# Patient Record
Sex: Male | Born: 1955 | Race: White | Hispanic: No | Marital: Married | Smoking: Never smoker
Health system: Western US, Academic
[De-identification: ages and names within clinical notes are randomized; demographics above are authoritative.]

## PROBLEM LIST (undated history)

## (undated) DIAGNOSIS — Z86010 Personal history of colonic polyps: Secondary | ICD-10-CM

## (undated) DIAGNOSIS — E785 Hyperlipidemia, unspecified: Secondary | ICD-10-CM

## (undated) DIAGNOSIS — N529 Male erectile dysfunction, unspecified: Secondary | ICD-10-CM

## (undated) DIAGNOSIS — J9601 Acute respiratory failure with hypoxia: Secondary | ICD-10-CM

## (undated) DIAGNOSIS — E349 Endocrine disorder, unspecified: Secondary | ICD-10-CM

## (undated) HISTORY — DX: Personal history of colonic polyps: Z86.010

## (undated) HISTORY — PX: FINGER FRACTURE SURGERY: SHX638

## (undated) HISTORY — DX: Male erectile dysfunction, unspecified: N52.9

## (undated) HISTORY — DX: Acute respiratory failure with hypoxia: J96.01

## (undated) HISTORY — DX: Hyperlipidemia, unspecified: E78.5

## (undated) HISTORY — DX: Endocrine disorder, unspecified: E34.9

---

## 1981-10-19 HISTORY — PX: OTHER SURGICAL HISTORY: SHX169

## 2004-11-13 ENCOUNTER — Ambulatory Visit: Payer: Self-pay | Admitting: Internal Medicine

## 2004-11-20 ENCOUNTER — Ambulatory Visit: Payer: Self-pay | Admitting: Internal Medicine

## 2004-12-03 ENCOUNTER — Ambulatory Visit: Payer: Self-pay | Admitting: Internal Medicine

## 2005-01-01 ENCOUNTER — Ambulatory Visit: Payer: Self-pay | Admitting: Internal Medicine

## 2005-01-08 ENCOUNTER — Ambulatory Visit: Payer: Self-pay | Admitting: Internal Medicine

## 2005-10-09 ENCOUNTER — Emergency Department (HOSPITAL_COMMUNITY): Admission: EM | Admit: 2005-10-09 | Discharge: 2005-10-09 | Payer: Self-pay | Admitting: Family Medicine

## 2006-01-19 ENCOUNTER — Ambulatory Visit: Payer: Self-pay | Admitting: Internal Medicine

## 2006-04-06 ENCOUNTER — Ambulatory Visit: Payer: Self-pay | Admitting: Internal Medicine

## 2006-11-29 ENCOUNTER — Ambulatory Visit: Payer: Self-pay | Admitting: Internal Medicine

## 2007-03-22 ENCOUNTER — Ambulatory Visit: Payer: Self-pay | Admitting: Internal Medicine

## 2007-04-08 ENCOUNTER — Ambulatory Visit: Payer: Self-pay | Admitting: Internal Medicine

## 2007-04-08 LAB — CONVERTED CEMR LAB
ALT: 21 units/L (ref 0–40)
AST: 21 units/L (ref 0–37)
Albumin: 3.9 g/dL (ref 3.5–5.2)
Alkaline Phosphatase: 58 units/L (ref 39–117)
BUN: 17 mg/dL (ref 6–23)
Basophils Absolute: 0 10*3/uL (ref 0.0–0.1)
Basophils Relative: 0.6 % (ref 0.0–1.0)
Bilirubin, Direct: 0.1 mg/dL (ref 0.0–0.3)
CO2: 31 meq/L (ref 19–32)
Calcium: 9.4 mg/dL (ref 8.4–10.5)
Chloride: 108 meq/L (ref 96–112)
Cholesterol: 274 mg/dL (ref 0–200)
Creatinine, Ser: 1.1 mg/dL (ref 0.4–1.5)
Direct LDL: 195 mg/dL
Eosinophils Absolute: 0.3 10*3/uL (ref 0.0–0.6)
Eosinophils Relative: 5.4 % — ABNORMAL HIGH (ref 0.0–5.0)
GFR calc Af Amer: 91 mL/min
GFR calc non Af Amer: 75 mL/min
Glucose, Bld: 106 mg/dL — ABNORMAL HIGH (ref 70–99)
HCT: 44.8 % (ref 39.0–52.0)
HDL: 43.8 mg/dL (ref >39.0)
Hemoglobin: 15 g/dL (ref 13.0–17.0)
Lymphocytes Relative: 34.4 % (ref 12.0–46.0)
MCHC: 33.4 g/dL (ref 30.0–36.0)
MCV: 85.9 fL (ref 78.0–100.0)
Monocytes Absolute: 0.5 10*3/uL (ref 0.2–0.7)
Monocytes Relative: 7.8 % (ref 3.0–11.0)
Neutro Abs: 3 10*3/uL (ref 1.4–7.7)
Neutrophils Relative %: 51.8 % (ref 43.0–77.0)
PSA: 1.52 ng/mL (ref 0.10–4.00)
Platelets: 234 10*3/uL (ref 150–400)
Potassium: 4.6 meq/L (ref 3.5–5.1)
RBC: 5.22 M/uL (ref 4.22–5.81)
RDW: 12.6 % (ref 11.5–14.6)
Sodium: 142 meq/L (ref 135–145)
TSH: 2.59 microintl units/mL (ref 0.35–5.50)
Total Bilirubin: 1.1 mg/dL (ref 0.3–1.2)
Total CHOL/HDL Ratio: 6.3
Total Protein: 7.5 g/dL (ref 6.0–8.3)
Triglycerides: 131 mg/dL (ref 0–149)
VLDL: 26 mg/dL (ref 0–40)
WBC: 5.8 10*3/uL (ref 4.5–10.5)

## 2007-04-21 ENCOUNTER — Ambulatory Visit: Payer: Self-pay | Admitting: Internal Medicine

## 2007-04-25 DIAGNOSIS — E785 Hyperlipidemia, unspecified: Secondary | ICD-10-CM | POA: Insufficient documentation

## 2007-06-24 ENCOUNTER — Ambulatory Visit: Payer: Self-pay | Admitting: Gastroenterology

## 2007-08-01 ENCOUNTER — Encounter: Payer: Self-pay | Admitting: Internal Medicine

## 2007-08-01 ENCOUNTER — Ambulatory Visit: Payer: Self-pay | Admitting: Gastroenterology

## 2008-05-01 ENCOUNTER — Ambulatory Visit: Payer: Self-pay | Admitting: Internal Medicine

## 2008-11-09 ENCOUNTER — Ambulatory Visit: Payer: Self-pay | Admitting: Internal Medicine

## 2008-11-12 ENCOUNTER — Ambulatory Visit: Payer: Self-pay | Admitting: Internal Medicine

## 2008-11-12 ENCOUNTER — Telehealth: Payer: Self-pay | Admitting: *Deleted

## 2008-11-19 ENCOUNTER — Encounter: Payer: Self-pay | Admitting: Internal Medicine

## 2008-11-30 ENCOUNTER — Encounter: Admission: RE | Admit: 2008-11-30 | Discharge: 2008-11-30 | Payer: Self-pay | Admitting: Otolaryngology

## 2009-01-15 ENCOUNTER — Encounter: Payer: Self-pay | Admitting: Internal Medicine

## 2009-08-16 ENCOUNTER — Telehealth: Payer: Self-pay | Admitting: Internal Medicine

## 2009-08-16 ENCOUNTER — Encounter: Payer: Self-pay | Admitting: Internal Medicine

## 2009-08-23 ENCOUNTER — Encounter (INDEPENDENT_AMBULATORY_CARE_PROVIDER_SITE_OTHER): Payer: Self-pay | Admitting: *Deleted

## 2009-09-02 ENCOUNTER — Encounter (INDEPENDENT_AMBULATORY_CARE_PROVIDER_SITE_OTHER): Payer: Self-pay | Admitting: *Deleted

## 2009-09-26 ENCOUNTER — Encounter (INDEPENDENT_AMBULATORY_CARE_PROVIDER_SITE_OTHER): Payer: Self-pay | Admitting: *Deleted

## 2009-10-16 ENCOUNTER — Ambulatory Visit: Payer: Self-pay | Admitting: Internal Medicine

## 2009-10-16 LAB — CONVERTED CEMR LAB
ALT: 28 units/L (ref 0–53)
AST: 23 units/L (ref 0–37)
Alkaline Phosphatase: 52 units/L (ref 39–117)
Basophils Absolute: 0 10*3/uL (ref 0.0–0.1)
Bilirubin, Direct: 0.1 mg/dL (ref 0.0–0.3)
CO2: 30 meq/L (ref 19–32)
Chloride: 102 meq/L (ref 96–112)
Creatinine, Ser: 1.1 mg/dL (ref 0.4–1.5)
Direct LDL: 198.4 mg/dL
Eosinophils Relative: 5.5 % — ABNORMAL HIGH (ref 0.0–5.0)
HCT: 46.4 % (ref 39.0–52.0)
HDL: 45.7 mg/dL (ref >39.00)
Ketones, ur: NEGATIVE mg/dL
Leukocytes, UA: NEGATIVE
Lymphocytes Relative: 33 % (ref 12.0–46.0)
Monocytes Relative: 9.1 % (ref 3.0–12.0)
Neutrophils Relative %: 52.1 % (ref 43.0–77.0)
Platelets: 209 10*3/uL (ref 150.0–400.0)
Potassium: 4.2 meq/L (ref 3.5–5.1)
RDW: 12.7 % (ref 11.5–14.6)
Specific Gravity, Urine: 1.02 (ref 1.000–1.030)
Total Bilirubin: 1.4 mg/dL — ABNORMAL HIGH (ref 0.3–1.2)
Total CHOL/HDL Ratio: 6
Triglycerides: 249 mg/dL — ABNORMAL HIGH (ref 0.0–149.0)
Urine Glucose: NEGATIVE mg/dL
Urobilinogen, UA: 0.2 (ref 0.0–1.0)
VLDL: 49.8 mg/dL — ABNORMAL HIGH (ref 0.0–40.0)
WBC: 6.9 10*3/uL (ref 4.5–10.5)
pH: 6 (ref 5.0–8.0)

## 2009-10-23 ENCOUNTER — Ambulatory Visit: Payer: Self-pay | Admitting: Internal Medicine

## 2009-10-23 DIAGNOSIS — R300 Dysuria: Secondary | ICD-10-CM | POA: Insufficient documentation

## 2009-10-24 ENCOUNTER — Encounter: Payer: Self-pay | Admitting: Internal Medicine

## 2010-01-10 ENCOUNTER — Ambulatory Visit: Payer: Self-pay | Admitting: Internal Medicine

## 2010-01-10 LAB — CONVERTED CEMR LAB
ALT: 37 units/L (ref 0–53)
Albumin: 3.9 g/dL (ref 3.5–5.2)
Alkaline Phosphatase: 62 units/L (ref 39–117)
Bilirubin, Direct: 0.1 mg/dL (ref 0.0–0.3)
Cholesterol: 165 mg/dL (ref 0–200)
LDL Cholesterol: 86 mg/dL (ref 0–99)
Total Protein: 7.5 g/dL (ref 6.0–8.3)
Triglycerides: 140 mg/dL (ref 0.0–149.0)
VLDL: 28 mg/dL (ref 0.0–40.0)

## 2010-01-14 ENCOUNTER — Ambulatory Visit: Payer: Self-pay | Admitting: Internal Medicine

## 2010-01-20 ENCOUNTER — Ambulatory Visit: Payer: Self-pay | Admitting: Internal Medicine

## 2010-01-20 DIAGNOSIS — E291 Testicular hypofunction: Secondary | ICD-10-CM | POA: Insufficient documentation

## 2010-01-20 DIAGNOSIS — N138 Other obstructive and reflux uropathy: Secondary | ICD-10-CM | POA: Insufficient documentation

## 2010-01-20 DIAGNOSIS — N401 Enlarged prostate with lower urinary tract symptoms: Secondary | ICD-10-CM

## 2010-01-20 LAB — CONVERTED CEMR LAB: LH: 4.4 milliintl units/mL (ref 1.50–9.30)

## 2010-01-21 ENCOUNTER — Encounter: Payer: Self-pay | Admitting: Internal Medicine

## 2010-01-29 ENCOUNTER — Telehealth: Payer: Self-pay | Admitting: Gastroenterology

## 2010-02-17 ENCOUNTER — Encounter: Payer: Self-pay | Admitting: Internal Medicine

## 2010-08-04 ENCOUNTER — Telehealth: Payer: Self-pay | Admitting: Internal Medicine

## 2010-08-06 ENCOUNTER — Ambulatory Visit: Payer: Self-pay | Admitting: Internal Medicine

## 2010-08-06 LAB — CONVERTED CEMR LAB
Albumin: 4.2 g/dL (ref 3.5–5.2)
HDL: 47 mg/dL (ref >39.00)
LDL Cholesterol: 95 mg/dL (ref 0–99)
Sex Hormone Binding: 27 nmol/L (ref 13–71)
Testosterone Free: 44.1 pg/mL — ABNORMAL LOW (ref 47.0–244.0)
Testosterone-% Free: 2.1 % (ref 1.6–2.9)
Testosterone: 206.57 ng/dL — ABNORMAL LOW (ref 250–890)
Total Bilirubin: 1 mg/dL (ref 0.3–1.2)
Total CHOL/HDL Ratio: 4
Total CK: 186 units/L (ref 7–232)
Triglycerides: 139 mg/dL (ref 0.0–149.0)

## 2010-08-12 ENCOUNTER — Ambulatory Visit: Payer: Self-pay | Admitting: Internal Medicine

## 2010-09-23 ENCOUNTER — Encounter: Payer: Self-pay | Admitting: Internal Medicine

## 2010-11-18 NOTE — Progress Notes (Signed)
Summary: Pt req to get lipids added to testosterone lvl  & lab for muscle  Phone Note Call from Patient Call back at Work Phone [phone]   Caller: Patient Complaint: Cough/Sore throat Summary of Call: Pt is going to [facility] at [facility] to have Testosterone lvls checked on 08/06/10 as previously ordered. Pt also req to get a lipid panel also a panel to check pts muscles. Pt has been experiencing soreness in several joints and muscles for the past month and is getting worse. Pt has a fup appt with Dr. Cato Mulligan on 08/12/10. Pls advise.   Initial call taken by: Lucy Antigua,  August 04, 2010 2:08 PM  Follow-up for Phone Call        add labs lipids 272.4 liver 995.2 add CK  Follow-up by: Birdie Sons MD,  August 05, 2010 8:55 AM  Additional Follow-up for Phone Call Additional follow up Details #1::        Labs have been added, as noted above.  Additional Follow-up by: Lucy Antigua,  August 06, 2010 10:00 AM

## 2010-11-18 NOTE — Assessment & Plan Note (Signed)
Summary: CPX/CJR   Vital Signs:  Patient profile:   55 year old male Height:      70.5 inches Weight:      238 pounds BMI:     33.79 Pulse rate:   64 / minute Resp:     12 per minute BP sitting:   110 / 86  (left arm)  Vitals Entered By: Gladis Riffle, RN (October 23, 2009 8:09 AM)   History of Present Illness: CPX  new problems: has noted progressive erectile dysfunction associated with some prostate discomfort with erections. Has also noted some urinary hesitancy.  Has hx of hyperlipidemia---noncompliant with previous medications.  All other systems reviewed and were negative   Preventive Screening-Counseling & Management  Alcohol-Tobacco     Smoking Status: quit > 6 months     Year Quit: 2000  Comments: tried but never really liked, occasional cigar but does not like it either  Current Problems (verified): 1)  Hyperlipidemia  (ICD-272.4)  Current Medications (verified): 1)  None  Allergies (verified): No Known Drug Allergies  Comments:  Nurse/Medical Assistant: cpx, labs done--has form  The patient's medications were reviewed with the patient's parent and were updated in the Medication and Allergy Lists. Gladis Riffle, RN (October 23, 2009 8:11 AM)  Family History: father-deceased MI/AAA.Marland Kitchen? coccidiomycosis 52 mother- deceased age 32--? leukemia.Marland Kitchen also hx of CAD/stents  Social History: not smoking    Married Smoking Status:  quit > 6 months  Physical Exam  General:  Well-developed,well-nourished,in no acute distress; alert,appropriate and cooperative throughout examination Head:  normocephalic and atraumatic.   Eyes:  pupils equal and pupils round.   Ears:  R ear normal and L ear normal.   Neck:  No deformities, masses, or tenderness noted. no bruits     Lungs:  normal respiratory effort and no intercostal retractions.   Heart:  normal rate and regular rhythm.   Abdomen:  Bowel sounds positive,abdomen soft and non-tender without masses, organomegaly  or hernias noted.  obese Rectal:  No external abnormalities noted. Normal sphincter tone. No rectal masses or tenderness. Prostate:  no nodules and no asymmetry.  moderately enlarged Msk:  No deformity or scoliosis noted of thoracic or lumbar spine.   Pulses:  present  Neurologic:  cranial nerves II-XII intact and gait normal.   Skin:  turgor normal and color normal.  no rash or vesicle seen  Cervical Nodes:  tender left ac area no mass or bruit    no pc tendernss  Psych:  Oriented X3, normally interactive, and good eye contact.     Impression & Recommendations:  Problem # 1:  Preventive Health Care (ICD-V70.0) health maint UTD  Problem # 2:  HYPERLIPIDEMIA (ICD-272.4) discussed at length ---side effects discussed His updated medication list for this problem includes:    Simvastatin 40 Mg Tabs (Simvastatin) .Marland Kitchen... Take one tablet at bedtime  Problem # 3:  ERECTILE DYSFUNCTION (ICD-302.72)  discussed---trial cialis---side effectsdiscussed  His updated medication list for this problem includes:    Cialis 20 Mg Tabs (Tadalafil) .Marland Kitchen... 1/2- 1 tablet every other day as needed for erectile dysfunction  Complete Medication List: 1)  Simvastatin 40 Mg Tabs (Simvastatin) .... Take one tablet at bedtime 2)  Cialis 20 Mg Tabs (Tadalafil) .... 1/2- 1 tablet every other day as needed for erectile dysfunction  Other Orders: T-Culture, Urine ([id number])  Preventive Care Screening  Colonoscopy:    Date:  08/01/2007    Next Due:  07/2012    Results:  abnormal, polyp    Patient Instructions: 1)  6 weeks no office visit 2)  lipids 272.4 3)  liver 995.2  Prescriptions: CIALIS 20 MG TABS (TADALAFIL) 1/2- 1 tablet every other day as needed for erectile dysfunction  #10 x 11   Entered and Authorized by:   Birdie Sons MD   Signed by:   Birdie Sons MD on 10/23/2009   Method used:   Print then Give to Patient   RxID:   [id number] SIMVASTATIN 40 MG TABS (SIMVASTATIN) Take one  tablet at bedtime  #90 x 3   Entered and Authorized by:   Birdie Sons MD   Signed by:   Birdie Sons MD on 10/23/2009   Method used:   Electronically to        [facility] [address] (retail)       [address]       [place], [place]  [address]       Ph: [phone] or [phone]       Fax: [phone]   RxID:   [id number]   Prevention & Chronic Care Immunizations   Influenza vaccine: Not documented    Tetanus booster: 10/19/2000: Td    Pneumococcal vaccine: Not documented  Colorectal Screening   Hemoccult: Not documented    Colonoscopy: abnormal, polyp  (08/01/2007)   Colonoscopy due: 07/2012  Other Screening   PSA: 2.57  (10/16/2009)   Smoking status: quit > 6 months  (10/23/2009)  Lipids   Total Cholesterol: 280  (10/16/2009)   LDL: DEL  (04/08/2007)   LDL Direct: 198.4  (10/16/2009)   HDL: 45.70  (10/16/2009)   Triglycerides: 249.0  (10/16/2009)    SGOT (AST): 23  (10/16/2009)   SGPT (ALT): 28  (10/16/2009)   Alkaline phosphatase: 52  (10/16/2009)   Total bilirubin: 1.4  (10/16/2009)  Self-Management Support :    Lipid self-management support: Not documented     Preventive Care Screening  Colonoscopy:    Date:  08/01/2007    Next Due:  07/2012    Results:  abnormal, polyp

## 2010-11-18 NOTE — Assessment & Plan Note (Signed)
Summary: lab FU/et/pt rescd from bump//ccm   Vital Signs:  Patient profile:   55 year old male Pulse rate:   64 / minute Pulse rhythm:   regular Resp:     12 per minute BP sitting:   110 / 80  (left arm)  Vitals Entered By: Gladis Riffle, RN (January 20, 2010 7:50 AM) CC: lab FU, discuss abdominal discomfort x 1 month or so Is Patient Diabetic? No   CC:  lab FU and discuss abdominal discomfort x 1 month or so.  History of Present Illness:  Follow-Up Visit      This is a 55 year old man who presents for Follow-up visit.  The patient denies chest pain and palpitations.  Since the last visit the patient notes no new problems or concerns.  The patient reports taking meds as prescribed.  When questioned about possible medication side effects, the patient notes none.    All other systems reviewed and were negative   Preventive Screening-Counseling & Management  Alcohol-Tobacco     Smoking Status: quit > 6 months     Year Quit: 2000  Current Medications (verified): 1)  Simvastatin 40 Mg Tabs (Simvastatin) .... Take One Tablet At Bedtime 2)  Cialis 20 Mg Tabs (Tadalafil) .... 1/2- 1 Tablet Every Other Day As Needed For Erectile Dysfunction  Allergies (verified): No Known Drug Allergies  Past History:  Past Medical History: Last updated: 11/12/2008 Hyperlipidemia      Past Surgical History: Last updated: 11/09/2008 fifth finger fx R   Family History: Last updated: 2009/11/11 father-deceased MI/AAA.Marland Kitchen? coccidiomycosis 9 mother- deceased age 65--? leukemia.Marland Kitchen also hx of CAD/stents  Social History: Last updated: 11-11-09 not smoking    Married  Risk Factors: Smoking Status: quit > 6 months (01/20/2010)  Physical Exam  General:  Well-developed,well-nourished,in no acute distress; alert,appropriate and cooperative throughout examination Head:  normocephalic and atraumatic.   Eyes:  pupils equal and pupils round.   Ears:  R ear normal and L ear normal.   Neck:  No  deformities, masses, or tenderness noted. no bruits     Lungs:  normal respiratory effort and no intercostal retractions.   Heart:  normal rate and regular rhythm.   Abdomen:  Bowel sounds positive,abdomen soft and non-tender without masses, organomegaly or hernias noted.  obese Msk:  No deformity or scoliosis noted of thoracic or lumbar spine.   Neurologic:  cranial nerves II-XII intact and gait normal.     Impression & Recommendations:  Problem # 1:  HYPERLIPIDEMIA (ICD-272.4) controlled continue current medications  His updated medication list for this problem includes:    Simvastatin 40 Mg Tabs (Simvastatin) .Marland Kitchen... Take one tablet at bedtime  Labs Reviewed: SGOT: 25 (01/10/2010)   SGPT: 37 (01/10/2010)   HDL:51.50 (01/10/2010), 45.70 (10/16/2009)  LDL:86 (01/10/2010), DEL (16/07/9603)  Chol:165 (01/10/2010), 280 (10/16/2009)  Trig:140.0 (01/10/2010), 249.0 (10/16/2009)  Problem # 2:  HYPOGONADISM (ICD-257.2) discussed at length trial clomid  Problem # 3:  DIARRHEA (ICD-787.91)  has been exposed to "purified water"  Orders: T-Culture, Stool ([id number]) T-Stool Giardia / Crypto- EIA (54098) T-Stool for O&P (11914-78295) T-Fecal WBC ([id number])  Complete Medication List: 1)  Simvastatin 40 Mg Tabs (Simvastatin) .... Take one tablet at bedtime 2)  Cialis 20 Mg Tabs (Tadalafil) .... 1/2- 1 tablet every other day as needed for erectile dysfunction 3)  Clomiphene Citrate 50 Mg Tabs (Clomiphene citrate) .... 1/2 tablet mwf  Other Orders: Urology Referral (Urology)  Patient Instructions: 1)  Please schedule a  follow-up appointment in 3 months. 2)  testosterone (total and free) one week prior Prescriptions: CLOMIPHENE CITRATE 50 MG TABS (CLOMIPHENE CITRATE) 1/2 tablet MWF  #30 x 1   Entered and Authorized by:   Birdie Sons MD   Signed by:   Birdie Sons MD on 01/20/2010   Method used:   Electronically to        [facility] [address]  (retail)       [address]       [place], [place]  [address]       Ph: [phone] or [phone]       Fax: [phone]   RxID:   [id number]

## 2010-11-18 NOTE — Letter (Signed)
Summary: [facility]  [facility]   Imported By: Maryln Gottron 02/26/2010 13:20:30  _____________________________________________________________________  External Attachment:    Type:   Image     Comment:   External Document

## 2010-11-18 NOTE — Progress Notes (Signed)
Summary: Switch from dr Jarold Motto to DR Leone Payor   Phone Note From Other Clinic   Caller: Aurther Loft @ Dr Cato Mulligan [phone] Call For: Dr Jarold Motto Summary of Call: Wants to switch from Dr Jarold Motto to Dr Leone Payor because he says he is a personal friend of Dr Leone Payor and would rather see him. Initial call taken by: Leanor Kail [facility],  January 29, 2010 3:41 PM  Follow-up for Phone Call        [place] with me... Follow-up by: Mardella Layman MD FACG,  January 30, 2010 8:34 AM  Additional Follow-up for Phone Call Additional follow up Details #1::        ok Iva Boop MD, [facility]  January 30, 2010 10:29 AM     Additional Follow-up for Phone Call Additional follow up Details #2::    Next available appoinment scheduled with Aurther Loft at Dr Agnes Lawrence office. Mar 10, 2010 3pm. New patient paperwork mailed to patient. Follow-up by: Leanor Kail [facility],  January 31, 2010 10:33 AM

## 2010-11-18 NOTE — Assessment & Plan Note (Signed)
Summary: fup re: testosterone lvl/cjr   Vital Signs:  Patient profile:   55 year old male Weight:      235 pounds BMI:     33.36 Temp:     98.4 degrees F oral Pulse rate:   68 / minute BP sitting:   112 / 82  (left arm) Cuff size:   large  Vitals Entered By: Alfred Levins, CMA (August 12, 2010 8:22 AM) CC: discuss labs   CC:  discuss labs.  History of Present Illness:  Follow-Up Visit      This is a 55 year old man who presents for Follow-up visit.  The patient denies chest pain and palpitations.  Since the last visit the patient notes no new problems or concerns except multiple MSK complaints. Especially right biceps area "painful in the bone".  The patient reports taking meds as prescribed.  When questioned about possible medication side effects, the patient notes none.    he reports feeling generally weaker than normal. still able to go on long hikes  All other systems reviewed and were negative   Current Problems (verified): 1)  Benign Prostatic Hypertrophy, With Obstruction  (ICD-600.01) 2)  Hypogonadism  (ICD-257.2) 3)  Dysuria, Chronic  (ICD-788.1) 4)  Erectile Dysfunction  (ICD-302.72) 5)  Preventive Health Care  (ICD-V70.0) 6)  Hyperlipidemia  (ICD-272.4)  Current Medications (verified): 1)  Simvastatin 40 Mg Tabs (Simvastatin) .... Take One Tablet At Bedtime 2)  Cialis 20 Mg Tabs (Tadalafil) .... 1/2- 1 Tablet Every Other Day As Needed For Erectile Dysfunction 3)  Clomiphene Citrate 50 Mg Tabs (Clomiphene Citrate) .... 1/2 Tablet Mwf  Allergies (verified): No Known Drug Allergies  Physical Exam  General:  Well-developed,well-nourished,in no acute distress; alert,appropriate and cooperative throughout examination Head:  normocephalic and atraumatic.   Eyes:  pupils equal and pupils round.   Ears:  R ear normal and L ear normal.   Neck:  No deformities, masses, or tenderness noted. Lungs:  normal respiratory effort and no intercostal retractions.   Heart:   normal rate and regular rhythm.   Abdomen:  Bowel sounds positive,abdomen soft and non-tender without masses, organomegaly or hernias noted.  obese Skin:  turgor normal and color normal.   Psych:  normally interactive and good eye contact.     Impression & Recommendations:  Problem # 1:  HYPOGONADISM (ICD-257.2) i suspect the primary cause of his fatigue clomid not effective change to testosterone replacement check level in 3 months side effects discussed  Problem # 2:  HYPERLIPIDEMIA (ICD-272.4) controlled His updated medication list for this problem includes:    Simvastatin 40 Mg Tabs (Simvastatin) .Marland Kitchen... Take one tablet at bedtime  Labs Reviewed: SGOT: 23 (08/06/2010)   SGPT: 30 (08/06/2010)   HDL:47.00 (08/06/2010), 51.50 (01/10/2010)  LDL:95 (08/06/2010), 86 (01/10/2010)  Chol:170 (08/06/2010), 165 (01/10/2010)  Trig:139.0 (08/06/2010), 140.0 (01/10/2010)  Problem # 3:  ARM PAIN, RIGHT (ICD-729.5)  only nocturnal sxs unusual story probably worth taking an xray  Orders: T-Humerus Right (73060TC)  Complete Medication List: 1)  Simvastatin 40 Mg Tabs (Simvastatin) .... Take one tablet at bedtime 2)  Cialis 20 Mg Tabs (Tadalafil) .... 1/2- 1 tablet every other day as needed for erectile dysfunction 3)  Testosterone Cypionate 200 Mg/ml Oil (Testosterone cypionate) .Marland Kitchen.. 1cc intramuscularly monthly  Patient Instructions: 1)  3 months. testosterone level only 2)  call for results of labs about 5-7 days after lab work Prescriptions: TESTOSTERONE CYPIONATE 200 MG/ML OIL (TESTOSTERONE CYPIONATE) 1cc intramuscularly monthly  #10 cc  x 0   Entered and Authorized by:   Birdie Sons MD   Signed by:   Birdie Sons MD on 08/12/2010   Method used:   Print then Give to Patient   RxID:   [id number]    Orders Added: 1)  Est. Patient Level IV [32355] 2)  T-Humerus Right [73060TC]

## 2010-11-18 NOTE — Medication Information (Signed)
Summary: Prior Authorization and Approval for Androderm  Prior Authorization and Approval for Androderm   Imported By: Maryln Gottron 09/25/2010 12:58:06  _____________________________________________________________________  External Attachment:    Type:   Image     Comment:   External Document

## 2011-03-18 ENCOUNTER — Other Ambulatory Visit: Payer: Self-pay | Admitting: Internal Medicine

## 2011-03-18 DIAGNOSIS — R0600 Dyspnea, unspecified: Secondary | ICD-10-CM

## 2011-04-10 ENCOUNTER — Encounter: Payer: Self-pay | Admitting: Internal Medicine

## 2011-04-10 ENCOUNTER — Ambulatory Visit (INDEPENDENT_AMBULATORY_CARE_PROVIDER_SITE_OTHER): Payer: BC Managed Care – PPO | Admitting: Internal Medicine

## 2011-04-10 VITALS — BP 128/82 | HR 84 | Temp 99.0°F | Ht 70.5 in | Wt 247.0 lb

## 2011-04-10 DIAGNOSIS — R0989 Other specified symptoms and signs involving the circulatory and respiratory systems: Secondary | ICD-10-CM

## 2011-04-10 DIAGNOSIS — R0600 Dyspnea, unspecified: Secondary | ICD-10-CM

## 2011-04-10 DIAGNOSIS — E291 Testicular hypofunction: Secondary | ICD-10-CM

## 2011-04-10 DIAGNOSIS — R0609 Other forms of dyspnea: Secondary | ICD-10-CM

## 2011-04-10 MED ORDER — ASPIRIN 325 MG PO TABS: 325.0000 mg | ORAL_TABLET | Freq: Every day | ORAL | Status: AC

## 2011-04-10 MED ORDER — METOPROLOL TARTRATE 25 MG PO TABS: 25.0000 mg | ORAL_TABLET | Freq: Two times a day (BID) | ORAL | Status: DC

## 2011-04-10 MED ORDER — NITROGLYCERIN 0.4 MG SL SUBL: 0.4000 mg | SUBLINGUAL_TABLET | SUBLINGUAL | Status: DC | PRN

## 2011-04-10 NOTE — Assessment & Plan Note (Addendum)
I'm concerned with the patient's history. He needs further evaluation. I'll schedule for a stress Myoview. I've given him a prescription for nitroglycerin. I've also asked him to start taking aspirin 325 mg by mouth daily. Side effects discussed. Given his bradycardia I will not start a beta blocker at this time.

## 2011-04-10 NOTE — Progress Notes (Signed)
  Subjective:    Patient ID: Derek Pennington, male    DOB: 1956-02-05, 55 y.o.   MRN: [id number]  HPI  55 year old patient with known hyperlipidemia comes in for evaluation of symptoms of urinary retention, depression, fatigue and shortness of breath. Depression---a lot of stress at work  He is mostly concerned about dyspnea- states with any exertion "uphill" he gets very SOB. He was able to complete a 55 mile hike. He does admit to some chest tightness. Has associated dizziness.   Urinary retention---ongoing problem for quite some time.  Past Medical History  Diagnosis Date  . Hyperlipidemia   . Testosterone deficiency   . ED (erectile dysfunction)    Past Surgical History  Procedure Date  . Finger fracture surgery     fifth    reports that he has quit smoking. He does not have any smokeless tobacco history on file. He reports that he drinks alcohol. He reports that he does not use illicit drugs. family history includes Aneurysm in his father; Heart attack in his father; Heart disease in his mother; and Leukemia in his mother. Not on File   Review of Systems  . Denies lower extremity edema, abdominal pain, change in appetite, change in bowel movements. Patient denies rashes, musculoskeletal complaints. No other specific complaints in a complete review of systems except. He admits to erectile dysfunction, recurrent urinary urgency     Objective:   Physical Exam Obese male in no acute distress. HEENT exam atraumatic, normocephalic, extraocular muscles are intact. Neck supple without lymphadenopathy, thyromegaly, jugular venous distention. Chest is clear to auscultation without increased work of breathing. Cardiac exam S1-S2 are regular. Extremities there is no clubbing or cyanosis. He has good peripheral pulses. No bruits noted.       Assessment & Plan:

## 2011-04-21 ENCOUNTER — Ambulatory Visit (HOSPITAL_COMMUNITY): Payer: BC Managed Care – PPO | Attending: Internal Medicine | Admitting: Radiology

## 2011-04-21 VITALS — Ht 70.0 in | Wt 242.0 lb

## 2011-04-21 DIAGNOSIS — R0989 Other specified symptoms and signs involving the circulatory and respiratory systems: Secondary | ICD-10-CM

## 2011-04-21 DIAGNOSIS — R0600 Dyspnea, unspecified: Secondary | ICD-10-CM

## 2011-04-21 DIAGNOSIS — I491 Atrial premature depolarization: Secondary | ICD-10-CM

## 2011-04-21 DIAGNOSIS — R0789 Other chest pain: Secondary | ICD-10-CM

## 2011-04-21 DIAGNOSIS — R55 Syncope and collapse: Secondary | ICD-10-CM

## 2011-04-21 DIAGNOSIS — R0609 Other forms of dyspnea: Secondary | ICD-10-CM | POA: Insufficient documentation

## 2011-04-21 MED ORDER — TECHNETIUM TC 99M TETROFOSMIN IV KIT
33.0000 | PACK | Freq: Once | INTRAVENOUS | Status: AC | PRN
  Administered 2011-04-21: 33 via INTRAVENOUS

## 2011-04-21 MED ORDER — TECHNETIUM TC 99M TETROFOSMIN IV KIT
11.0000 | PACK | Freq: Once | INTRAVENOUS | Status: AC | PRN
  Administered 2011-04-21: 11 via INTRAVENOUS

## 2011-04-21 NOTE — Progress Notes (Signed)
[facility] SITE 3 NUCLEAR MED [address] [place] [address] [phone]  Cardiology Nuclear Med Study  Staton Markey is a 55 y.o. male [id number] 1956/06/19   Nuclear Med Background Indication for Stress Test:  Evaluation for Ischemia History:  '04 MWU:XLKGMW per patient. Cardiac Risk Factors: Family History - CAD, History of Smoking and Lipids  Symptoms:  Chest Tightness (last episode of chest discomfort was last night), Diaphoresis, Dizziness, DOE and Near Syncope   Nuclear Pre-Procedure Caffeine/Decaff Intake:  None NPO After: 9:00pm   Lungs:  Clear. IV 0.9% NS with Angio Cath:  18g  IV Site: R Antecubital  IV Started by:  Stanton Kidney, EMT-P  Chest Size (in):  46 Cup Size: n/a  Height: 5\' 10"  (1.778 m)  Weight:  242 lb (109.77 kg)  BMI:  Body mass index is 34.72 kg/(m^2). Tech Comments:  NA    Nuclear Med Study 1 or 2 day study: 1 day  Stress Test Type:  Stress  Reading MD: Charlton Haws, MD  Order Authorizing Provider:  Birdie Sons, MD  Resting Radionuclide: Technetium 33m Tetrofosmin  Resting Radionuclide Dose: 11 mCi   Stress Radionuclide:  Technetium 15m Tetrofosmin  Stress Radionuclide Dose: 33 mCi           Stress Protocol Rest HR: 57 Stress HR: 160  Rest BP: 108/84 Stress BP: 201/88  Exercise Time (min): 9:00 METS: 10.4   Predicted Max HR: 165 bpm % Max HR: 96.97 bpm Rate Pressure Product: 10272   Dose of Adenosine (mg):  n/a Dose of Lexiscan: n/a mg  Dose of Atropine (mg): n/a Dose of Dobutamine: n/a mcg/kg/min (at max HR)  Stress Test Technologist: Smiley Houseman, CMA-N  Nuclear Technologist:  Domenic Polite, CNMT     Rest Procedure:  Myocardial perfusion imaging was performed at rest 45 minutes following the intravenous administration of Technetium 36m Tetrofosmin.  Rest ECG: Nonspecific T-wave changes.  Stress Procedure:  The patient exercised for nine minutes on the treadmill utilizing the Bruce protocol.  The patient  stopped due to fatigue and denied any chest pain during exercise, but did c/o chest tightness, 1-2/10, four minutes into recovery .  There were no diagnostic ST-T wave changes, just rare PAC's.  He did have a mild hypertensive response to exercise, 201/88.  Technetium 54m Tetrofosmin was injected at peak exercise and myocardial perfusion imaging was performed after a brief delay.  Stress ECG: No significant change from baseline ECG  QPS Raw Data Images:  Normal; no motion artifact; normal heart/lung ratio. Stress Images:  Normal homogeneous uptake in all areas of the myocardium. Rest Images:  Normal homogeneous uptake in all areas of the myocardium. Subtraction (SDS):  Normal Transient Ischemic Dilatation (Normal <1.22):  .90 Lung/Heart Ratio (Normal <0.45):  .38  Quantitative Gated Spect Images QGS EDV:  107 ml QGS ESV:  38 ml QGS cine images:  NL LV Function; NL Wall Motion QGS EF: 64%  Impression Exercise Capacity:  Fair exercise capacity. BP Response:  Hypertensive blood pressure response. Clinical Symptoms:  Mild chest pain/dyspnea. ECG Impression:  No significant ST segment change suggestive of ischemia. Comparison with Prior Nuclear Study: No previous nuclear study performed  Overall Impression:  Normal stress nuclear study.     Charlton Haws

## 2011-04-27 NOTE — Progress Notes (Signed)
Nuc med study routed to Dr. Cato Mulligan.04/27/11 Derek Pennington

## 2011-04-30 ENCOUNTER — Telehealth: Payer: Self-pay | Admitting: *Deleted

## 2011-04-30 NOTE — Telephone Encounter (Signed)
Pt is calling for stress test results, and what he should do next....?  Should he make an appt with Dr. Cato Mulligan? After reading Dr. Marliss Coots last office note, it is obvious he is concerned about this pt and the results of his stress test.  Will ask Dr. Lovell Sheehan for guidance on this, and send message to Dr. Cato Mulligan just in case he is looking at messages.

## 2011-04-30 NOTE — Telephone Encounter (Signed)
Pt given results, and will call back to schedule an appt with Dr. Cato Mulligan to discuss tests in detail, and what to do next.

## 2011-04-30 NOTE — Telephone Encounter (Signed)
Here it is

## 2011-05-08 ENCOUNTER — Telehealth: Payer: Self-pay | Admitting: *Deleted

## 2011-05-08 NOTE — Telephone Encounter (Signed)
Pt was told of finding ,but states something is still not right and request an ov to discuss- ov with dr burchette 7-26

## 2011-05-08 NOTE — Telephone Encounter (Signed)
Pt calling to get results of stress test done a few weeks ago.  Needs know what his treatment plan would be based on results.  Does he need to see another physician since [facility] is out or should he wait to follow up when [facility] returns?

## 2011-05-08 NOTE — Telephone Encounter (Signed)
Per dr Lovell Sheehan- normal stress test with some increase in bp with exertion- needs to loose weight and that would help

## 2011-05-14 ENCOUNTER — Ambulatory Visit: Payer: BC Managed Care – PPO | Admitting: Family Medicine

## 2011-05-18 ENCOUNTER — Ambulatory Visit: Payer: BC Managed Care – PPO | Admitting: Internal Medicine

## 2012-06-29 ENCOUNTER — Other Ambulatory Visit: Payer: Self-pay | Admitting: Internal Medicine

## 2012-07-04 ENCOUNTER — Other Ambulatory Visit: Payer: BC Managed Care – PPO

## 2012-07-11 ENCOUNTER — Encounter: Payer: BC Managed Care – PPO | Admitting: Family

## 2012-07-22 ENCOUNTER — Telehealth: Payer: Self-pay | Admitting: Internal Medicine

## 2012-07-22 NOTE — Telephone Encounter (Signed)
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 Please advise.

## 2012-07-25 NOTE — Telephone Encounter (Signed)
You can use that 8:30 slot on 10/25 and tell pt to come in at 8

## 2012-08-11 ENCOUNTER — Encounter: Payer: Self-pay | Admitting: Internal Medicine

## 2012-08-12 ENCOUNTER — Ambulatory Visit (INDEPENDENT_AMBULATORY_CARE_PROVIDER_SITE_OTHER): Payer: BC Managed Care – PPO | Admitting: Internal Medicine

## 2012-08-12 ENCOUNTER — Encounter: Payer: Self-pay | Admitting: Internal Medicine

## 2012-08-12 VITALS — BP 116/88 | HR 76 | Temp 98.1°F | Ht 71.0 in | Wt 244.0 lb

## 2012-08-12 DIAGNOSIS — Z0000 Encounter for general adult medical examination without abnormal findings: Secondary | ICD-10-CM

## 2012-08-12 DIAGNOSIS — Z23 Encounter for immunization: Secondary | ICD-10-CM

## 2012-08-12 LAB — HEPATIC FUNCTION PANEL
ALT: 24 U/L (ref 0–53)
AST: 22 U/L (ref 0–37)
Albumin: 3.8 g/dL (ref 3.5–5.2)
Alkaline Phosphatase: 60 U/L (ref 39–117)

## 2012-08-12 LAB — BASIC METABOLIC PANEL
BUN: 17 mg/dL (ref 6–23)
Calcium: 9.2 mg/dL (ref 8.4–10.5)
Creatinine, Ser: 1.1 mg/dL (ref 0.4–1.5)
GFR: 72.66 mL/min (ref >60.00)
Glucose, Bld: 79 mg/dL (ref 70–99)

## 2012-08-12 LAB — POCT URINALYSIS DIPSTICK
Bilirubin, UA: NEGATIVE
Ketones, UA: NEGATIVE
pH, UA: 6.5

## 2012-08-12 LAB — CBC WITH DIFFERENTIAL/PLATELET
Basophils Absolute: 0.1 10*3/uL (ref 0.0–0.1)
HCT: 45 % (ref 39.0–52.0)
Lymphocytes Relative: 27 % (ref 12.0–46.0)
Lymphs Abs: 1.9 10*3/uL (ref 0.7–4.0)
Monocytes Relative: 9.6 % (ref 3.0–12.0)
Neutrophils Relative %: 56.3 % (ref 43.0–77.0)
Platelets: 219 10*3/uL (ref 150.0–400.0)
RDW: 13.4 % (ref 11.5–14.6)
WBC: 7 10*3/uL (ref 4.5–10.5)

## 2012-08-12 LAB — TSH: TSH: 1.66 u[IU]/mL (ref 0.35–5.50)

## 2012-08-12 LAB — TESTOSTERONE: Testosterone: 271.24 ng/dL — ABNORMAL LOW (ref 350.00–890.00)

## 2012-08-12 LAB — LIPID PANEL
Total CHOL/HDL Ratio: 4
Triglycerides: 134 mg/dL (ref 0.0–149.0)

## 2012-08-12 NOTE — Progress Notes (Signed)
Patient ID: Derek Pennington, male   DOB: 1955-11-13, 56 y.o.   MRN: [id number]  CPX  Past Medical History  Diagnosis Date  . Hyperlipidemia   . Testosterone deficiency   . ED (erectile dysfunction)     History   Social History  . Marital Status: Married    Spouse Name: N/A    Number of Children: N/A  . Years of Education: N/A   Occupational History  . Not on file.   Social History Main Topics  . Smoking status: Former Games developer  . Smokeless tobacco: Not on file  . Alcohol Use: Yes  . Drug Use: No  . Sexually Active: Not on file   Other Topics Concern  . Not on file   Social History Narrative  . No narrative on file    Past Surgical History  Procedure Date  . Finger fracture surgery     fifth    Family History  Problem Relation Age of Onset  . Heart disease Mother     stents  . Leukemia Mother   . Heart attack Father   . Aneurysm Father     AAA    No Known Allergies  Current Outpatient Prescriptions on File Prior to Visit  Medication Sig Dispense Refill  . simvastatin (ZOCOR) 40 MG tablet TAKE ONE TABLET AT BEDTIME  90 tablet  2  . tadalafil (CIALIS) 10 MG tablet Take 10 mg by mouth daily as needed.      . testosterone cypionate (DEPOTESTOTERONE CYPIONATE) 200 MG/ML injection Inject 200 mg into the muscle every 14 (fourteen) days.        Marland Kitchen DISCONTD: nitroGLYCERIN (NITROSTAT) 0.4 MG SL tablet Place 1 tablet (0.4 mg total) under the tongue every 5 (five) minutes as needed for chest pain.  100 tablet  3     patient denies chest pain, shortness of breath, orthopnea. Denies lower extremity edema, abdominal pain, change in appetite, change in bowel movements. Patient denies rashes, musculoskeletal complaints. No other specific complaints in a complete review of systems, except for decrease urinary flow, no nocturia.  BP 116/88  Pulse 76  Temp 98.1 F (36.7 C) (Oral)  Ht 5\' 11"  (1.803 m)  Wt 244 lb (110.678 kg)  BMI 34.03 kg/m2 Well-developed male in no acute  distress. HEENT exam atraumatic, normocephalic, extraocular muscles are intact. Conjunctivae are pink without exudate. Neck is supple without lymphadenopathy, thyromegaly, jugular venous distention. Chest is clear to auscultation without increased work of breathing. Cardiac exam S1-S2 are regular. The PMI is normal. No significant murmurs or gallops. Abdominal exam active bowel sounds, soft, nontender. No abdominal bruits. Extremities no clubbing cyanosis or edema. Peripheral pulses are normal without bruits. Neurologic exam alert and oriented without any motor or sensory deficits. Rectal exam normal tone prostate normal size without masses or asymmetry.   Well Visit: health maint UTD

## 2012-08-15 ENCOUNTER — Other Ambulatory Visit: Payer: Self-pay | Admitting: *Deleted

## 2012-08-15 LAB — TB SKIN TEST: TB Skin Test: NEGATIVE

## 2012-08-15 MED ORDER — TADALAFIL 10 MG PO TABS: 10.0000 mg | ORAL_TABLET | Freq: Every day | ORAL | Status: DC | PRN

## 2012-08-15 MED ORDER — SIMVASTATIN 40 MG PO TABS: 40.0000 mg | ORAL_TABLET | Freq: Every day | ORAL | Status: DC

## 2012-08-15 MED ORDER — TESTOSTERONE 20.25 MG/1.25GM (1.62%) TD GEL: 2.0000 | Freq: Every day | TRANSDERMAL | Status: DC

## 2013-01-02 ENCOUNTER — Telehealth: Payer: Self-pay | Admitting: *Deleted

## 2013-01-02 MED ORDER — OSELTAMIVIR PHOSPHATE 75 MG PO CAPS: 75.0000 mg | ORAL_CAPSULE | Freq: Every day | ORAL | Status: DC

## 2013-01-02 NOTE — Telephone Encounter (Signed)
Pt of Dr. Cato Mulligan, wife diagnosed with flu today, wife requested her husband be treated preventively with Tamiflu.  Rx sent to pt pharmacy

## 2013-02-02 ENCOUNTER — Telehealth: Payer: Self-pay | Admitting: Internal Medicine

## 2013-02-02 NOTE — Telephone Encounter (Signed)
Check testosterone and then we'll make a decision

## 2013-02-02 NOTE — Telephone Encounter (Signed)
Patient Information:  Caller Name: Cutler  Phone: [phone]  Patient: Telesforo, Brosnahan  Gender: Male  DOB: 20-Nov-1955  Age: 57 Years  PCP: Birdie Sons (Adults only)  Office Follow Up:  Does the office need to follow up with this patient?: Yes  Instructions For The Office: Please address patient's question regarding medication change.  Thank you.   Symptoms  Reason For Call & Symptoms: Patient reports he has been prescribed  Androgel and would like to try Axiron instead  as he has discount coupons for same.  Uses [facility] at [facility]  He is agreeable to use Androgel if MD feels there is significant benefit.  Reviewed Health History In EMR: Yes  Reviewed Medications In EMR: Yes  Reviewed Allergies In EMR: Yes  Reviewed Surgeries / Procedures: Yes  Date of Onset of Symptoms: Unknown  Guideline(s) Used:  No Protocol Available - Information Only  Disposition Per Guideline:   Discuss with PCP and Callback by Nurse Today  Reason For Disposition Reached:   Nursing judgment  Advice Given:  Call Back If:  New symptoms develop  You become worse.  Patient Will Follow Care Advice:  YES

## 2013-02-06 ENCOUNTER — Telehealth: Payer: Self-pay | Admitting: Internal Medicine

## 2013-02-06 NOTE — Telephone Encounter (Signed)
lmovm to schedule

## 2013-02-06 NOTE — Telephone Encounter (Signed)
Opened in error

## 2013-02-06 NOTE — Telephone Encounter (Signed)
Please refer to previous note. Per patient he has only been taking testosterone for one week and just need the previous request expedited. Please assist.

## 2013-02-06 NOTE — Telephone Encounter (Signed)
Call pt and schedule lab appt for testosterone levels

## 2013-02-07 NOTE — Telephone Encounter (Signed)
Change from androgel to axiron

## 2013-02-08 MED ORDER — TESTOSTERONE 30 MG/ACT TD SOLN: 1.0000 "application " | Freq: Every day | TRANSDERMAL | Status: DC

## 2013-02-08 NOTE — Telephone Encounter (Signed)
axiron 1 application to alternating underarms daily #3/1 refill

## 2013-02-08 NOTE — Telephone Encounter (Signed)
rx called in, pt aware 

## 2013-02-26 ENCOUNTER — Other Ambulatory Visit: Payer: Self-pay | Admitting: Internal Medicine

## 2013-04-24 ENCOUNTER — Encounter: Payer: Self-pay | Admitting: Internal Medicine

## 2013-04-24 ENCOUNTER — Telehealth: Payer: Self-pay

## 2013-04-24 NOTE — Telephone Encounter (Signed)
Left message on mobile and home #'s to call us back so we may set up pre-visit and colonoscopy appointment date/times.  Dx: V12.72 per Dr. Leone Payor.

## 2013-05-19 HISTORY — PX: COLONOSCOPY: SHX174

## 2013-05-22 ENCOUNTER — Ambulatory Visit (AMBULATORY_SURGERY_CENTER): Payer: BC Managed Care – PPO | Admitting: *Deleted

## 2013-05-22 ENCOUNTER — Encounter: Payer: Self-pay | Admitting: Internal Medicine

## 2013-05-22 ENCOUNTER — Telehealth: Payer: Self-pay | Admitting: Internal Medicine

## 2013-05-22 ENCOUNTER — Other Ambulatory Visit: Payer: Self-pay | Admitting: Internal Medicine

## 2013-05-22 VITALS — Ht 71.0 in | Wt 249.2 lb

## 2013-05-22 DIAGNOSIS — N401 Enlarged prostate with lower urinary tract symptoms: Secondary | ICD-10-CM

## 2013-05-22 DIAGNOSIS — Z860100 Personal history of colon polyps, unspecified: Secondary | ICD-10-CM

## 2013-05-22 DIAGNOSIS — N138 Other obstructive and reflux uropathy: Secondary | ICD-10-CM

## 2013-05-22 DIAGNOSIS — F528 Other sexual dysfunction not due to a substance or known physiological condition: Secondary | ICD-10-CM

## 2013-05-22 MED ORDER — NA SULFATE-K SULFATE-MG SULF 17.5-3.13-1.6 GM/177ML PO SOLN: ORAL | Status: DC

## 2013-05-22 NOTE — Telephone Encounter (Signed)
Pt would like to schedule testosterone level. Pt stated MD request to check his level before any  More refills

## 2013-05-22 NOTE — Telephone Encounter (Signed)
Please schedule lab appt

## 2013-05-22 NOTE — Progress Notes (Signed)
No allergies to eggs or soy. No problems with anesthesia.  

## 2013-05-22 NOTE — Telephone Encounter (Signed)
Scheduled

## 2013-05-23 ENCOUNTER — Other Ambulatory Visit (INDEPENDENT_AMBULATORY_CARE_PROVIDER_SITE_OTHER): Payer: BC Managed Care – PPO

## 2013-05-23 DIAGNOSIS — F528 Other sexual dysfunction not due to a substance or known physiological condition: Secondary | ICD-10-CM

## 2013-05-23 DIAGNOSIS — N401 Enlarged prostate with lower urinary tract symptoms: Secondary | ICD-10-CM

## 2013-05-23 DIAGNOSIS — N138 Other obstructive and reflux uropathy: Secondary | ICD-10-CM

## 2013-05-25 ENCOUNTER — Other Ambulatory Visit: Payer: Self-pay | Admitting: Internal Medicine

## 2013-06-01 ENCOUNTER — Encounter: Payer: Self-pay | Admitting: Internal Medicine

## 2013-06-01 ENCOUNTER — Ambulatory Visit (AMBULATORY_SURGERY_CENTER): Payer: BC Managed Care – PPO | Admitting: Internal Medicine

## 2013-06-01 VITALS — BP 120/73 | HR 58 | Temp 98.0°F | Resp 16 | Ht 71.0 in | Wt 249.0 lb

## 2013-06-01 DIAGNOSIS — D126 Benign neoplasm of colon, unspecified: Secondary | ICD-10-CM

## 2013-06-01 DIAGNOSIS — Z86010 Personal history of colonic polyps: Secondary | ICD-10-CM

## 2013-06-01 MED ORDER — SODIUM CHLORIDE 0.9 % IV SOLN: 500.0000 mL | INTRAVENOUS | Status: DC

## 2013-06-01 NOTE — Op Note (Signed)
[facility] [address] [place], [address]   COLONOSCOPY PROCEDURE REPORT  PATIENT: Derek Pennington, Derek Pennington  MR#: [id number] BIRTHDATE: 09-Apr-1956 , 57  yrs. old GENDER: Male ENDOSCOPIST: Iva Boop, MD, [name] PROCEDURE DATE:  06/01/2013 PROCEDURE:   Colonoscopy with snare polypectomy First Screening Colonoscopy - Avg.  risk and is 50 yrs.  old or older - No.  Prior Negative Screening - Now for repeat screening. N/A  History of Adenoma - Now for follow-up colonoscopy & has been > or = to 3 yrs.  Yes hx of adenoma.  Has been 3 or more years since last colonoscopy.  Polyps Removed Today? Yes. ASA CLASS:   Class II INDICATIONS:Patient's personal history of colon polyps.   Polyp removed MEDICATIONS: propofol (Diprivan) 200mg  IV, MAC sedation, administered by CRNA, and These medications were titrated to patient response per physician's verbal order  DESCRIPTION OF PROCEDURE:   After the risks benefits and alternatives of the procedure were thoroughly explained, informed consent was obtained.  A digital rectal exam revealed no abnormalities of the rectum, A digital rectal exam revealed no prostatic nodules, and A digital rectal exam revealed the prostate was not enlarged.   The LB [id number]  endoscope was introduced through the anus and advanced to the cecum, which was identified by both the appendix and ileocecal valve. No adverse events experienced.   The quality of the prep was excellent using Suprep  The instrument was then slowly withdrawn as the colon was fully examined.    COLON FINDINGS: Three diminutive sessile polyps were found in the transverse colon and at the splenic flexure.  A polypectomy was performed with a cold snare.  The resection was complete and the polyp tissue was completely retrieved.   The colon mucosa was otherwise normal.  Retroflexed views revealed no abnormalities. The time to cecum=1 minutes 59 seconds.  Withdrawal time=10  minutes 39 seconds.  The scope was withdrawn and the procedure completed. COMPLICATIONS: There were no complications.  ENDOSCOPIC IMPRESSION: 1.   Three diminutive sessile polyps were found in the transverse colon and at the splenic flexure; polypectomy was performed with a cold snare 2.   The colon mucosa was otherwise normal - excellent prep - prior polyp removed not recovered  RECOMMENDATIONS: Timing of repeat colonoscopy will be determined by pathology findings.   eSigned:  Iva Boop, MD, [facility] 06/01/2013 3:33 PM  cc: The Patient

## 2013-06-01 NOTE — Progress Notes (Signed)
Patient did not experience any of the following events: a burn prior to discharge; a fall within the facility; wrong site/side/patient/procedure/implant event; or a hospital transfer or hospital admission upon discharge from the facility. (G8907) Patient did not have preoperative order for IV antibiotic SSI prophylaxis. (G8918)  

## 2013-06-01 NOTE — Progress Notes (Signed)
A/ox3 pleased with MAC, report to Karen RN 

## 2013-06-01 NOTE — Patient Instructions (Addendum)
I found and removed 3 small polyps that look benign. Everything else was ok.  I will let you know pathology results and when to have another routine colonoscopy by mail.  I appreciate the opportunity to care for you. Iva Boop, MD, FACG     YOU HAD AN ENDOSCOPIC PROCEDURE TODAY AT THE [facility]: Refer to the procedure report that was given to you for any specific questions about what was found during the examination.  If the procedure report does not answer your questions, please call your gastroenterologist to clarify.  If you requested that your care partner not be given the details of your procedure findings, then the procedure report has been included in a sealed envelope for you to review at your convenience later.  YOU SHOULD EXPECT: Some feelings of bloating in the abdomen. Passage of more gas than usual.  Walking can help get rid of the air that was put into your GI tract during the procedure and reduce the bloating. If you had a lower endoscopy (such as a colonoscopy or flexible sigmoidoscopy) you may notice spotting of blood in your stool or on the toilet paper. If you underwent a bowel prep for your procedure, then you may not have a normal bowel movement for a few days.  DIET: Your first meal following the procedure should be a light meal and then it is ok to progress to your normal diet.  A half-sandwich or bowl of soup is an example of a good first meal.  Heavy or fried foods are harder to digest and may make you feel nauseous or bloated.  Likewise meals heavy in dairy and vegetables can cause extra gas to form and this can also increase the bloating.  Drink plenty of fluids but you should avoid alcoholic beverages for 24 hours.  ACTIVITY: Your care partner should take you home directly after the procedure.  You should plan to take it easy, moving slowly for the rest of the day.  You can resume normal activity the day after the procedure however you should NOT  DRIVE or use heavy machinery for 24 hours (because of the sedation medicines used during the test).    SYMPTOMS TO REPORT IMMEDIATELY: A gastroenterologist can be reached at any hour.  During normal business hours, 8:30 AM to 5:00 PM Monday through Friday, call [phone].  After hours and on weekends, please call the GI answering service at [phone] who will take a message and have the physician on call contact you.   Following lower endoscopy (colonoscopy or flexible sigmoidoscopy):  Excessive amounts of blood in the stool  Significant tenderness or worsening of abdominal pains  Swelling of the abdomen that is new, acute  Fever of 100F or higher  FOLLOW UP: If any biopsies were taken you will be contacted by phone or by letter within the next 1-3 weeks.  Call your gastroenterologist if you have not heard about the biopsies in 3 weeks.  Our staff will call the home number listed on your records the next business day following your procedure to check on you and address any questions or concerns that you may have at that time regarding the information given to you following your procedure. This is a courtesy call and so if there is no answer at the home number and we have not heard from you through the emergency physician on call, we will assume that you have returned to your regular daily activities without incident.  SIGNATURES/CONFIDENTIALITY: You and/or your care partner have signed paperwork which will be entered into your electronic medical record.  These signatures attest to the fact that that the information above on your After Visit Summary has been reviewed and is understood.  Full responsibility of the confidentiality of this discharge information lies with you and/or your care-partner.

## 2013-06-01 NOTE — Progress Notes (Signed)
Called to room to assist during endoscopic procedure.  Patient ID and intended procedure confirmed with present staff. Received instructions for my participation in the procedure from the performing physician.  

## 2013-06-02 ENCOUNTER — Telehealth: Payer: Self-pay

## 2013-06-02 NOTE — Telephone Encounter (Signed)
  Follow up Call-  Call back number 06/01/2013  Post procedure Call Back phone  # [phone]  Permission to leave phone message Yes     Left message on answering machine.

## 2013-06-07 ENCOUNTER — Encounter: Payer: Self-pay | Admitting: Internal Medicine

## 2013-06-07 DIAGNOSIS — Z860100 Personal history of colon polyps, unspecified: Secondary | ICD-10-CM | POA: Insufficient documentation

## 2013-06-07 HISTORY — DX: Personal history of colon polyps, unspecified: Z86.0100

## 2013-06-07 HISTORY — DX: Personal history of colonic polyps: Z86.010

## 2013-06-07 NOTE — Progress Notes (Signed)
Quick Note:  2 diminutive adenomas Repeat colon 2019 ______ 

## 2013-09-28 ENCOUNTER — Other Ambulatory Visit (INDEPENDENT_AMBULATORY_CARE_PROVIDER_SITE_OTHER): Payer: BC Managed Care – PPO

## 2013-09-28 ENCOUNTER — Telehealth: Payer: Self-pay | Admitting: Internal Medicine

## 2013-09-28 DIAGNOSIS — E349 Endocrine disorder, unspecified: Secondary | ICD-10-CM

## 2013-09-28 DIAGNOSIS — E291 Testicular hypofunction: Secondary | ICD-10-CM

## 2013-09-28 NOTE — Telephone Encounter (Signed)
Pt had testosterone level checked.  Do you need to see him afterwards?

## 2013-09-28 NOTE — Telephone Encounter (Signed)
Pt calling to request order to have testosterone level checked today.  States his medication was adjusted and it is time for his levels to be checked.  Pt states he will also need refill of axiron.

## 2013-09-28 NOTE — Telephone Encounter (Signed)
Future order placed 

## 2013-09-28 NOTE — Telephone Encounter (Signed)
Patient had lab appointment today and was wondering if he needs to set up an appointment for a visit with the doctor in regards to his lab visit today. He will be out of town so you can use his cell phone number to call him on.

## 2013-09-29 NOTE — Telephone Encounter (Signed)
Pt aware.

## 2013-09-29 NOTE — Telephone Encounter (Signed)
No. We will call him with instructions

## 2013-10-05 ENCOUNTER — Telehealth: Payer: Self-pay | Admitting: Internal Medicine

## 2013-10-05 NOTE — Telephone Encounter (Signed)
Pt waiting on instructions about med for testosterone. pt down to the last day. pls advise. Pharm: [facility]

## 2013-10-06 ENCOUNTER — Other Ambulatory Visit: Payer: Self-pay | Admitting: *Deleted

## 2013-10-06 DIAGNOSIS — E349 Endocrine disorder, unspecified: Secondary | ICD-10-CM

## 2013-10-06 MED ORDER — TESTOSTERONE 30 MG/ACT TD SOLN: 1.0000 "application " | Freq: Every day | TRANSDERMAL | Status: DC

## 2013-10-06 NOTE — Telephone Encounter (Signed)
Consulted with Derek Pennington, gave pt his instructions to continue normal usage of testosterone, please call in a refill. Pt leaving tomorrow. Pt will set an appointment for lab work when he returns.

## 2013-10-06 NOTE — Telephone Encounter (Signed)
Sent to pharmacy 

## 2013-10-27 ENCOUNTER — Other Ambulatory Visit: Payer: BC Managed Care – PPO

## 2013-10-27 DIAGNOSIS — E349 Endocrine disorder, unspecified: Secondary | ICD-10-CM

## 2013-10-30 LAB — TESTOSTERONE, FREE, TOTAL, SHBG
SEX HORMONE BINDING: 21 nmol/L (ref 13–71)
Testosterone, Free: 45.4 pg/mL — ABNORMAL LOW (ref 47.0–244.0)
Testosterone-% Free: 2.4 % (ref 1.6–2.9)
Testosterone: 189 ng/dL — ABNORMAL LOW (ref 300–890)

## 2013-11-03 ENCOUNTER — Telehealth: Payer: Self-pay | Admitting: Internal Medicine

## 2013-11-03 NOTE — Telephone Encounter (Signed)
Pt would like a call back to discuss his labs. Please advise.

## 2013-11-07 NOTE — Telephone Encounter (Signed)
See result note.  

## 2013-11-10 ENCOUNTER — Telehealth: Payer: Self-pay | Admitting: Internal Medicine

## 2013-11-10 MED ORDER — OSELTAMIVIR PHOSPHATE 75 MG PO CAPS: 75.0000 mg | ORAL_CAPSULE | Freq: Two times a day (BID) | ORAL | Status: DC

## 2013-11-10 NOTE — Telephone Encounter (Signed)
Talked to Dr Elease Hashimoto and per Dr Elease Hashimoto ok to call in Tamiflu 75 mg bid x5 days, rx sent in electronically, pt aware

## 2013-11-10 NOTE — Telephone Encounter (Signed)
Patient Information:  Caller Name: Haze  Phone: [phone]  Patient: Derek Pennington  Gender: Male  DOB: 1956-03-15  Age: 58 Years  PCP: Phoebe Sharps (Adults only)  Office Follow Up:  Does the office need to follow up with this patient?: Yes  Instructions For The Office: Tamiflu Rx krs/can  RN Note:  Onset of fever, cough, and wheeze 11/09/13 PM.  Son diagnosed with positive flu 11/08/13 and wants Tamiflu called in.  Per flu protocol, emergent symptoms denied; advised care measures, with callback parameters given.  Wants Tamiflu Rx; uses [facility].  Info to office for provider review/Rx/callback.  May reach patient at [phone].  krs/can  Symptoms  Reason For Call & Symptoms: influenza  Reviewed Health History In EMR: Yes  Reviewed Medications In EMR: Yes  Reviewed Allergies In EMR: Yes  Reviewed Surgeries / Procedures: Yes  Date of Onset of Symptoms: 11/09/2013  Any Fever: Yes  Fever Taken: Oral  Fever Time Of Reading: 11:30:00  Fever Last Reading: 102  Guideline(s) Used:  Influenza - Seasonal  Disposition Per Guideline:   Discuss with PCP and Callback by Nurse Today  Reason For Disposition Reached:   Patient requests antiviral medicine for influenza and flu symptoms present < 48 hours  Advice Given:  Reassurance  For most healthy adults, influenza feels like a bad cold. The dangers of influenza for normal, healthy people (under 79 years of age) are overrated.  The treatment of influenza depends on your main symptoms. Generally, treatment is the same as for other viral respiratory infections (colds). Bed rest is unnecessary.  Treating the Symptoms of Flu  Fever, Muscle Aches, and Headache: For fever more than 101 F (38.3 C), muscle aches, and headaches, take acetaminophen every 4-6 hours (Adults 650 mg) OR ibuprofen every 6-8 hours (Adults 400-600 mg).  Sore Throat: Use throat lozenges, hard candy or warm chicken broth.  Cough: Use cough  drops.  Hydrate: Drink extra liquids. If the air in your home is dry, use a humidifier.  No Aspirin  : Do not use aspirin for treatment of fever or pain (Reason: there is an association between influenza and Reye syndrome).  Isolation is Needed Until After the Fever is Gone:   The CDC recommends that people with influenza-like illness remain at home until at least 24 hours after they are free of fever (100 F or 37.8C).  Do NOT go to work or school.  Do NOT go to church, child care centers, shopping, or other public places.  Do NOT shake hands.  Avoid close contact with others (hugging, kissing).  Expected Course  : The fever lasts 2-3 days, the runny nose 5-10 days, and the cough 2-3 weeks.  Expected Course  : The fever lasts 2-3 days, the runny nose 5-10 days, and the cough 2-3 weeks.  Call Back If:  Fever lasts more than 3 days  Runny nose lasts more than 10 days  Cough lasts more than 3 weeks  You become short of breath or worse.  Patient Will Follow Care Advice:  YES

## 2013-11-10 NOTE — Progress Notes (Signed)
Yes, stop topical testosterone  Testosterone cypionate 200mg /ml. Inject 1cc IM (at [place]) q 2weeks  10 cc/1 refill.  Check testosterone level in 12 weeks

## 2013-11-30 ENCOUNTER — Encounter: Payer: Self-pay | Admitting: Family Medicine

## 2013-11-30 ENCOUNTER — Ambulatory Visit (INDEPENDENT_AMBULATORY_CARE_PROVIDER_SITE_OTHER): Payer: BC Managed Care – PPO | Admitting: Family Medicine

## 2013-11-30 VITALS — BP 120/84 | Temp 98.8°F | Wt 246.0 lb

## 2013-11-30 DIAGNOSIS — J45909 Unspecified asthma, uncomplicated: Secondary | ICD-10-CM

## 2013-11-30 MED ORDER — PREDNISONE 20 MG PO TABS: ORAL_TABLET | ORAL | Status: DC

## 2013-11-30 MED ORDER — HYDROCODONE-HOMATROPINE 5-1.5 MG/5ML PO SYRP: 5.0000 mL | ORAL_SOLUTION | Freq: Three times a day (TID) | ORAL | Status: DC | PRN

## 2013-11-30 NOTE — Patient Instructions (Signed)
Prednisone 20 mg...... 2 tabs x3 days or until you feel a lot better then taper as outlined  Hydromet......Marland Kitchen 1/2-1 teaspoon at bedtime when necessary for cough

## 2013-11-30 NOTE — Progress Notes (Signed)
   Subjective:    Patient ID: Derek Pennington, male    DOB: June 11, 1956, 57 y.o.   MRN: [id number]  HPI Derek Pennington is a 58 year old male nonsmoker........ 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 It's nonproductive no fever and he has some soreness in his right chest when he coughs a lot. Review of systems negative  Environmental contact......... nothing new at home he has cats and dogs no history of allergy to either.......Marland Kitchen recently been working in the lab with cats and formaldehyde but they have a shield and that's only been recent and not related to the cough he feels   Review of Systems    review of systems otherwise negative no history of asthma or pneumonia Objective:   Physical Exam  Well-developed well-nourished male no acute distress vital signs stable he is afebrile ENT negative neck was supple lungs were clear      Assessment & Plan:  Reactive airway disease......Marland Kitchen prednisone burst and taper

## 2013-11-30 NOTE — Progress Notes (Signed)
Pre visit review using our clinic review tool, if applicable. No additional management support is needed unless otherwise documented below in the visit note. 

## 2013-12-04 ENCOUNTER — Ambulatory Visit: Payer: BC Managed Care – PPO | Admitting: Family Medicine

## 2013-12-06 ENCOUNTER — Telehealth: Payer: Self-pay | Admitting: Internal Medicine

## 2013-12-06 DIAGNOSIS — J45909 Unspecified asthma, uncomplicated: Secondary | ICD-10-CM

## 2013-12-06 MED ORDER — HYDROCODONE-HOMATROPINE 5-1.5 MG/5ML PO SYRP: 5.0000 mL | ORAL_SOLUTION | Freq: Three times a day (TID) | ORAL | Status: DC | PRN

## 2013-12-06 NOTE — Telephone Encounter (Signed)
Rx ready for pick up and patient is aware.  Patient informed that if symptoms do not improve he should schedule an office visit.

## 2013-12-06 NOTE — Telephone Encounter (Signed)
Pt is requesting another rx for hydrocodone cough syrup. Pt still has cough

## 2013-12-07 ENCOUNTER — Telehealth: Payer: Self-pay | Admitting: Internal Medicine

## 2013-12-07 NOTE — Telephone Encounter (Signed)
Appt scheduled

## 2013-12-07 NOTE — Telephone Encounter (Signed)
Per Dr Sherren Mocha patient should follow up with Dr Leanne Chang

## 2013-12-07 NOTE — Telephone Encounter (Signed)
Pt seen by dr todd 2/16 dx w/ RAD.  Pt states he is NOT better, actually feels worse. Pleurisy is worse at night. Pt would like to know what he should do. come back in, or go to specialist?  But wants something ASAP

## 2013-12-11 ENCOUNTER — Ambulatory Visit (INDEPENDENT_AMBULATORY_CARE_PROVIDER_SITE_OTHER)
Admission: RE | Admit: 2013-12-11 | Discharge: 2013-12-11 | Disposition: A | Discharge status: Home / Self Care | Payer: BC Managed Care – PPO | Source: Ambulatory Visit | Attending: Internal Medicine | Admitting: Internal Medicine

## 2013-12-11 ENCOUNTER — Inpatient Hospital Stay (HOSPITAL_COMMUNITY): Payer: BC Managed Care – PPO

## 2013-12-11 ENCOUNTER — Emergency Department (HOSPITAL_COMMUNITY): Payer: BC Managed Care – PPO

## 2013-12-11 ENCOUNTER — Encounter (HOSPITAL_COMMUNITY): Payer: Self-pay | Admitting: Emergency Medicine

## 2013-12-11 ENCOUNTER — Inpatient Hospital Stay (HOSPITAL_COMMUNITY)
Admission: EM | Admit: 2013-12-11 | Discharge: 2013-12-18 | DRG: 853 | Disposition: A | Discharge status: Home / Self Care | Payer: BC Managed Care – PPO | Attending: Pulmonary Disease | Admitting: Pulmonary Disease

## 2013-12-11 ENCOUNTER — Ambulatory Visit (INDEPENDENT_AMBULATORY_CARE_PROVIDER_SITE_OTHER): Payer: BC Managed Care – PPO | Admitting: Internal Medicine

## 2013-12-11 ENCOUNTER — Telehealth: Payer: Self-pay | Admitting: *Deleted

## 2013-12-11 ENCOUNTER — Telehealth: Payer: Self-pay | Admitting: Internal Medicine

## 2013-12-11 ENCOUNTER — Encounter: Payer: Self-pay | Admitting: Internal Medicine

## 2013-12-11 VITALS — BP 132/90 | HR 98 | Temp 98.0°F | Ht 71.0 in | Wt 242.0 lb

## 2013-12-11 DIAGNOSIS — R0600 Dyspnea, unspecified: Secondary | ICD-10-CM

## 2013-12-11 DIAGNOSIS — J154 Pneumonia due to other streptococci: Secondary | ICD-10-CM | POA: Diagnosis present

## 2013-12-11 DIAGNOSIS — Z79899 Other long term (current) drug therapy: Secondary | ICD-10-CM

## 2013-12-11 DIAGNOSIS — D62 Acute posthemorrhagic anemia: Secondary | ICD-10-CM | POA: Diagnosis not present

## 2013-12-11 DIAGNOSIS — J90 Pleural effusion, not elsewhere classified: Secondary | ICD-10-CM

## 2013-12-11 DIAGNOSIS — J45909 Unspecified asthma, uncomplicated: Secondary | ICD-10-CM

## 2013-12-11 DIAGNOSIS — J189 Pneumonia, unspecified organism: Secondary | ICD-10-CM | POA: Insufficient documentation

## 2013-12-11 DIAGNOSIS — J9600 Acute respiratory failure, unspecified whether with hypoxia or hypercapnia: Secondary | ICD-10-CM

## 2013-12-11 DIAGNOSIS — R0602 Shortness of breath: Secondary | ICD-10-CM

## 2013-12-11 DIAGNOSIS — A419 Sepsis, unspecified organism: Principal | ICD-10-CM | POA: Diagnosis present

## 2013-12-11 DIAGNOSIS — F528 Other sexual dysfunction not due to a substance or known physiological condition: Secondary | ICD-10-CM

## 2013-12-11 DIAGNOSIS — E785 Hyperlipidemia, unspecified: Secondary | ICD-10-CM

## 2013-12-11 DIAGNOSIS — J9601 Acute respiratory failure with hypoxia: Secondary | ICD-10-CM

## 2013-12-11 DIAGNOSIS — N138 Other obstructive and reflux uropathy: Secondary | ICD-10-CM

## 2013-12-11 DIAGNOSIS — Z87891 Personal history of nicotine dependence: Secondary | ICD-10-CM

## 2013-12-11 DIAGNOSIS — N529 Male erectile dysfunction, unspecified: Secondary | ICD-10-CM | POA: Diagnosis present

## 2013-12-11 DIAGNOSIS — R300 Dysuria: Secondary | ICD-10-CM

## 2013-12-11 DIAGNOSIS — R0609 Other forms of dyspnea: Secondary | ICD-10-CM

## 2013-12-11 DIAGNOSIS — R071 Chest pain on breathing: Secondary | ICD-10-CM | POA: Diagnosis present

## 2013-12-11 DIAGNOSIS — N401 Enlarged prostate with lower urinary tract symptoms: Secondary | ICD-10-CM

## 2013-12-11 DIAGNOSIS — J869 Pyothorax without fistula: Secondary | ICD-10-CM | POA: Diagnosis present

## 2013-12-11 DIAGNOSIS — N179 Acute kidney failure, unspecified: Secondary | ICD-10-CM | POA: Diagnosis present

## 2013-12-11 DIAGNOSIS — Z806 Family history of leukemia: Secondary | ICD-10-CM

## 2013-12-11 DIAGNOSIS — I10 Essential (primary) hypertension: Secondary | ICD-10-CM | POA: Diagnosis present

## 2013-12-11 DIAGNOSIS — G8918 Other acute postprocedural pain: Secondary | ICD-10-CM | POA: Diagnosis not present

## 2013-12-11 DIAGNOSIS — E291 Testicular hypofunction: Secondary | ICD-10-CM

## 2013-12-11 DIAGNOSIS — Z8249 Family history of ischemic heart disease and other diseases of the circulatory system: Secondary | ICD-10-CM

## 2013-12-11 DIAGNOSIS — R0989 Other specified symptoms and signs involving the circulatory and respiratory systems: Secondary | ICD-10-CM

## 2013-12-11 DIAGNOSIS — Z86010 Personal history of colonic polyps: Secondary | ICD-10-CM

## 2013-12-11 DIAGNOSIS — R6520 Severe sepsis without septic shock: Secondary | ICD-10-CM

## 2013-12-11 HISTORY — DX: Acute respiratory failure with hypoxia: J96.01

## 2013-12-11 LAB — I-STAT CHEM 8, ED
BUN: 20 mg/dL (ref 6–23)
Calcium, Ion: 1.13 mmol/L (ref 1.12–1.23)
Chloride: 98 meq/L (ref 96–112)
Creatinine, Ser: 1.3 mg/dL (ref 0.50–1.35)
Glucose, Bld: 135 mg/dL — ABNORMAL HIGH (ref 70–99)
HCT: 52 % (ref 39.0–52.0)
Hemoglobin: 17.7 g/dL — ABNORMAL HIGH (ref 13.0–17.0)
Potassium: 4.5 meq/L (ref 3.7–5.3)
Sodium: 136 meq/L — ABNORMAL LOW (ref 137–147)
TCO2: 28 mmol/L (ref 0–100)

## 2013-12-11 LAB — CBC WITH DIFFERENTIAL/PLATELET
BASOS PCT: 0 % (ref 0–1)
Basophils Absolute: 0 10*3/uL (ref 0.0–0.1)
EOS PCT: 0 % (ref 0–5)
Eosinophils Absolute: 0 10*3/uL (ref 0.0–0.7)
HEMATOCRIT: 46.3 % (ref 39.0–52.0)
Hemoglobin: 16.3 g/dL (ref 13.0–17.0)
LYMPHS PCT: 3 % — AB (ref 12–46)
Lymphs Abs: 1.1 10*3/uL (ref 0.7–4.0)
MCH: 30 pg (ref 26.0–34.0)
MCHC: 35.2 g/dL (ref 30.0–36.0)
MCV: 85.3 fL (ref 78.0–100.0)
MONO ABS: 2.3 10*3/uL — AB (ref 0.1–1.0)
Monocytes Relative: 6 % (ref 3–12)
Neutro Abs: 34.9 10*3/uL — ABNORMAL HIGH (ref 1.7–7.7)
Neutrophils Relative %: 91 % — ABNORMAL HIGH (ref 43–77)
Platelets: 262 10*3/uL (ref 150–400)
RBC: 5.43 MIL/uL (ref 4.22–5.81)
RDW: 13.5 % (ref 11.5–15.5)
WBC: 38.3 10*3/uL — ABNORMAL HIGH (ref 4.0–10.5)

## 2013-12-11 LAB — GLUCOSE, CAPILLARY: Glucose-Capillary: 110 mg/dL — ABNORMAL HIGH (ref 70–99)

## 2013-12-11 LAB — BODY FLUID CELL COUNT WITH DIFFERENTIAL
Eos, Fluid: 0 %
LYMPHS FL: 9 %
Monocyte-Macrophage-Serous Fluid: 6 % — ABNORMAL LOW (ref 50–90)
Neutrophil Count, Fluid: 85 % — ABNORMAL HIGH (ref 0–25)
WBC FLUID: 10627 uL — AB (ref 0–1000)

## 2013-12-11 LAB — LACTATE DEHYDROGENASE, PLEURAL OR PERITONEAL FLUID: LD, Fluid: 1116 U/L — ABNORMAL HIGH (ref 3–23)

## 2013-12-11 LAB — STREP PNEUMONIAE URINARY ANTIGEN: STREP PNEUMO URINARY ANTIGEN: NEGATIVE

## 2013-12-11 LAB — PROCALCITONIN: Procalcitonin: 3 ng/mL

## 2013-12-11 LAB — MRSA PCR SCREENING: MRSA by PCR: NEGATIVE

## 2013-12-11 MED ORDER — FENTANYL CITRATE 0.05 MG/ML IJ SOLN
100.0000 ug | Freq: Once | INTRAMUSCULAR | Status: AC
  Administered 2013-12-11: 100 ug via INTRAVENOUS
  Filled 2013-12-11: qty 2

## 2013-12-11 MED ORDER — VANCOMYCIN HCL IN DEXTROSE 1-5 GM/200ML-% IV SOLN
1000.0000 mg | Freq: Once | INTRAVENOUS | Status: DC
  Filled 2013-12-11: qty 200

## 2013-12-11 MED ORDER — AZITHROMYCIN 500 MG IV SOLR
500.0000 mg | INTRAVENOUS | Status: DC
  Administered 2013-12-11 – 2013-12-14 (×4): 500 mg via INTRAVENOUS
  Filled 2013-12-11 (×5): qty 500

## 2013-12-11 MED ORDER — KETOROLAC TROMETHAMINE 30 MG/ML IJ SOLN
30.0000 mg | Freq: Four times a day (QID) | INTRAMUSCULAR | Status: DC | PRN
  Administered 2013-12-11 – 2013-12-13 (×4): 30 mg via INTRAVENOUS
  Filled 2013-12-11 (×4): qty 1

## 2013-12-11 MED ORDER — IOHEXOL 350 MG/ML SOLN
100.0000 mL | Freq: Once | INTRAVENOUS | Status: AC | PRN
  Administered 2013-12-11: 100 mL via INTRAVENOUS

## 2013-12-11 MED ORDER — OXYCODONE-ACETAMINOPHEN 5-325 MG PO TABS
1.0000 | ORAL_TABLET | ORAL | Status: DC | PRN
  Administered 2013-12-11 – 2013-12-13 (×6): 1 via ORAL
  Filled 2013-12-11 (×6): qty 1

## 2013-12-11 MED ORDER — DEXTROSE-NACL 5-0.45 % IV SOLN
INTRAVENOUS | Status: DC
  Administered 2013-12-11: 1000 mL via INTRAVENOUS
  Administered 2013-12-12: 13:00:00 via INTRAVENOUS

## 2013-12-11 MED ORDER — PIPERACILLIN-TAZOBACTAM 3.375 G IVPB 30 MIN
3.3750 g | Freq: Once | INTRAVENOUS | Status: AC
  Administered 2013-12-11: 3.375 g via INTRAVENOUS
  Filled 2013-12-11: qty 50

## 2013-12-11 MED ORDER — DEXTROSE 5 % IV SOLN
1.0000 g | INTRAVENOUS | Status: DC
  Administered 2013-12-12 – 2013-12-17 (×6): 1 g via INTRAVENOUS
  Filled 2013-12-11 (×10): qty 10

## 2013-12-11 MED ORDER — VANCOMYCIN HCL 10 G IV SOLR
1500.0000 mg | Freq: Once | INTRAVENOUS | Status: AC
  Administered 2013-12-11: 1500 mg via INTRAVENOUS
  Filled 2013-12-11: qty 1500

## 2013-12-11 MED ORDER — SODIUM CHLORIDE 0.9 % IV SOLN: 250.0000 mL | INTRAVENOUS | Status: DC | PRN

## 2013-12-11 MED ORDER — MOXIFLOXACIN HCL 400 MG PO TABS: 400.0000 mg | ORAL_TABLET | Freq: Every day | ORAL | Status: DC

## 2013-12-11 MED ORDER — ENOXAPARIN SODIUM 40 MG/0.4ML ~~LOC~~ SOLN
40.0000 mg | SUBCUTANEOUS | Status: DC
  Administered 2013-12-11 – 2013-12-12 (×2): 40 mg via SUBCUTANEOUS
  Filled 2013-12-11 (×3): qty 0.4

## 2013-12-11 MED ORDER — ASPIRIN 81 MG PO CHEW: 324.0000 mg | CHEWABLE_TABLET | ORAL | Status: DC

## 2013-12-11 MED ORDER — ASPIRIN 300 MG RE SUPP: 300.0000 mg | RECTAL | Status: DC

## 2013-12-11 NOTE — ED Notes (Signed)
Dr Simonds at bedside. 

## 2013-12-11 NOTE — Telephone Encounter (Signed)
Pt's wife called stating she called 64 and pt is going to hospital.  She would like to know the results to his x-ray.

## 2013-12-11 NOTE — Telephone Encounter (Signed)
Called Dr Leanne Chang at [phone] to inform him of xray results, per Dr Leanne Chang call in Avelox 400 mg once daily x7 days.  Called pt at home and wife told me that pt was on his way to ER due to increased dyspnea.  Called Dr Leanne Chang back to see if he still wanted me to call in the Avelox or wait.  Per Dr Leanne Chang wait to call it in, pts wife aware.  Told wife to call me back in the morning to let me know how pt is doing.  Pt's wife verbalized understanding and had questions

## 2013-12-11 NOTE — Progress Notes (Signed)
Pre visit review using our clinic review tool, if applicable. No additional management support is needed unless otherwise documented below in the visit note. 

## 2013-12-11 NOTE — Telephone Encounter (Signed)
Patient Information:  Caller Name: Santa Genera  Phone: [phone]  Patient: Leslee, Haueter  Gender: Male  DOB: May 03, 1956  Age: 58 Years  PCP: Phoebe Sharps (Adults only)  Office Follow Up:  Does the office need to follow up with this patient?: Yes   Instructions For The Office: Office please call wife back regarding CHX results from today.    RN Note:  Instructed wife to hang up and call 911 now; will comply but she states that she would like for me to find out the results of his chest xray that he had done today after his OV that he had early today in our office  Symptoms  Reason For Call & Symptoms: EMERGENT CALL: Wife is calling and states that pt is having hard time breathing; can not get up and move without being very SOB; speaking single words only due to SOB;  instructed to hang up and call 911 now;  Reviewed Health History In EMR: N/A  Reviewed Medications In EMR: N/A  Reviewed Allergies In EMR: N/A  Reviewed Surgeries / Procedures: N/A  Date of Onset of Symptoms: 12/11/2013  Guideline(s) Used:  Breathing Difficulty  Disposition Per Guideline:   Call EMS 911 Now  Reason For Disposition Reached:   Severe difficulty breathing (e.g., struggling for each breath, speaks in single words, pulse > 120)  Advice Given:  N/A  Patient Will Follow Care Advice:  YES

## 2013-12-11 NOTE — Procedures (Addendum)
Thoracentesis Procedure Note  Pre-operative Diagnosis: parapneumonic effusion  Post-operative Diagnosis: same   Procedure Details  Consent: Informed consent was obtained. Risks of the procedure were discussed including: infection, bleeding, pain, pneumothorax.  Under sterile conditions the patient was positioned. Chlorhexidine solution and sterile drapes were utilized.  1% buffered lidocaine was used to anesthetize the rib space. Fluid was obtained without any difficulties and minimal blood loss.  A dressing was applied to the wound and wound care instructions were provided.   Findings 250 ml of turbid yellow pleural fluid was obtained. A sample was sent to Laboratory for chemistries, cell counts, GS and cx.  Complications:  None; patient tolerated the procedure well.          Condition: stable  Plan A follow up chest x-ray revealed no ptx  Attending Attestation: I performed the procedure.  Merton Border, MD ; [facility] [phone].  After 5:30 PM or weekends, call [phone]

## 2013-12-11 NOTE — ED Notes (Signed)
Attempted report 

## 2013-12-11 NOTE — Progress Notes (Signed)
See note from dr. Sherren Mocha  Patient continues to have a cough Last night had severe right sided chest pain- muscles became very tight-. Now when he tries to take a deep breath he has more pain. Treated with prednisone taper 11/30/13. Cough is nonproductive  He admits to intermittent sweating.  Patient also wants to discuss testosterone meds- I told him we need to   Has some SOB with moderate activity , no SOB at rest.   Reviewed pmh, psh, sochx, meds  ROS_ fatigue, some SOB, no documented fever Appetite normal No other complaints  BP 132/90  Pulse 98  Temp(Src) 98 F (36.7 C) (Oral)  Ht 5\' 11"  (1.803 m)  Wt 242 lb (109.77 kg)  BMI 33.77 kg/m2  SpO2 93% NAD RR= 15 Chest no wheeze, decreased breaths sounds right base Decrease percussion right base Fremitus right base cv- reg rate (= 74) abd- soft, nontender extr- no edema

## 2013-12-11 NOTE — ED Provider Notes (Signed)
CSN: [id number]     Arrival date & time 12/11/13  1432 History   First MD Initiated Contact with Patient 12/11/13 1446     Chief Complaint  Patient presents with  . Shortness of Breath     (Consider location/radiation/quality/duration/timing/severity/associated sxs/prior Treatment) The history is provided by the patient.   patient presents with increasing shortness of breath. He has had a cough for the last 2 weeks. He had increasing shortness of breath at around 9 PM last night. He states been having difficulty breathing, particularly with laying back. He states he got a spasm in the lower right chest last night. No fevers. His cough is nonproductive. He had an outpatient chest x-ray that showed right lower lobe pneumonia. He was told that time he continued to have trouble breathing or got worse to come to the ER. He does not smoke. He has had some recent travel, but all under 4 hours.  Past Medical History  Diagnosis Date  . Hyperlipidemia   . Testosterone deficiency   . ED (erectile dysfunction)   . Personal history of colonic adenomas 06/07/2013    2009 - diminutive polyp removed not recovered 05/2013 2 diminutive adenomas Repeat colon 2019 [name], MD, [facility]    Past Surgical History  Procedure Laterality Date  . Finger fracture surgery      fifth  . Lipoma back  1983   Family History  Problem Relation Age of Onset  . Heart disease Mother     stents  . Leukemia Mother   . Heart attack Father   . Aneurysm Father     AAA  . Colon cancer Neg Hx    History  Substance Use Topics  . Smoking status: Former Research scientist (life sciences)  . Smokeless tobacco: Never Used  . Alcohol Use: 2.4 oz/week    4 Glasses of wine per week    Review of Systems  Constitutional: Negative for activity change and appetite change.  Eyes: Negative for pain.  Respiratory: Positive for cough and shortness of breath. Negative for chest tightness.   Cardiovascular: Positive for chest pain. Negative for leg  swelling.  Gastrointestinal: Negative for nausea, vomiting, abdominal pain and diarrhea.  Genitourinary: Negative for flank pain.  Musculoskeletal: Negative for back pain and neck stiffness.  Skin: Negative for rash.  Neurological: Negative for weakness, numbness and headaches.  Psychiatric/Behavioral: Negative for behavioral problems.      Allergies  Review of patient's allergies indicates no known allergies.  Home Medications   Current Outpatient Rx  Name  Route  Sig  Dispense  Refill  . CIALIS 20 MG tablet      TAKE 1/2 TO 1 TABLET BY MOUTH EVERY OTHER DAY AS NEEDED FOR ERECTILE DYSFUNCTION   4 tablet   0     NEEDS OV   . HYDROcodone-homatropine (HYCODAN) 5-1.5 MG/5ML syrup   Oral   Take 5 mLs by mouth every 8 (eight) hours as needed for cough.   120 mL   0   . nitroGLYCERIN (NITROSTAT) 0.4 MG SL tablet   Sublingual   Place 0.4 mg under the tongue every 5 (five) minutes as needed for chest pain.          . predniSONE (DELTASONE) 20 MG tablet   Oral   Take 20 mg by mouth See admin instructions. Take 1 tab by mouth for 3 days, then take 1/2 tab by mouth for 3 days, then take 1/2 tab on Mondays Wednesdays and Fridays. Patient states he  is on his third week on the prednisone which is the (1/2 tab on Monday wed and Friday.)         . simvastatin (ZOCOR) 40 MG tablet   Oral   Take 1 tablet (40 mg total) by mouth at bedtime.   90 tablet   2     Rx has expired - no refills remain   . Testosterone 30 MG/ACT SOLN   Transdermal   Place 1 application onto the skin daily.   90 mL   5    BP 164/102  Pulse 116  Temp(Src) 99.6 F (37.6 C) (Oral)  Resp 26  SpO2 97% Physical Exam  Nursing note and vitals reviewed. Constitutional: He is oriented to person, place, and time. He appears well-developed and well-nourished.  HENT:  Head: Normocephalic and atraumatic.  Eyes: EOM are normal. Pupils are equal, round, and reactive to light.  Neck: Normal range of motion.  Neck supple.  Cardiovascular: Normal rate, regular rhythm and normal heart sounds.   No murmur heard. Pulmonary/Chest: He is in respiratory distress.  Tachypnea with mild respiratory distress. Tenderness right CVA area.  Abdominal: Soft. Bowel sounds are normal. He exhibits no distension and no mass. There is no tenderness. There is no rebound and no guarding.  Musculoskeletal: Normal range of motion. He exhibits no edema.  Neurological: He is alert and oriented to person, place, and time. No cranial nerve deficit.  Skin: Skin is warm and dry.  Psychiatric: He has a normal mood and affect.    ED Course  Procedures (including critical care time) Labs Review Labs Reviewed  CBC WITH DIFFERENTIAL - Abnormal; Notable for the following:    WBC 38.3 (*)    Neutrophils Relative % 91 (*)    Neutro Abs 34.9 (*)    Lymphocytes Relative 3 (*)    Monocytes Absolute 2.3 (*)    All other components within normal limits  I-STAT CHEM 8, ED - Abnormal; Notable for the following:    Sodium 136 (*)    Glucose, Bld 135 (*)    Hemoglobin 17.7 (*)    All other components within normal limits  BODY FLUID CULTURE  CULTURE, BLOOD (ROUTINE X 2)  CULTURE, BLOOD (ROUTINE X 2)  CBC  CREATININE, SERUM  LACTATE DEHYDROGENASE, BODY FLUID  BODY FLUID CELL COUNT WITH DIFFERENTIAL   Imaging Review Dg Chest 2 View  12/11/2013   CLINICAL DATA:  Fall.  EXAM: CHEST  2 VIEW  COMPARISON:  None.  FINDINGS: Right lower lobe infiltrate with right pleural effusion noted. Pleural fluid noted in the minor fissure. Basilar atelectasis. Possible infiltrate left lung base noted. Cardiomegaly. No pulmonary venous congestion. No acute osseous abnormality.  IMPRESSION: 1. Right lower lobe infiltrate consistent with pneumonia. Associated right pleural effusion is present with fluid in the minor fissure. 2. Left base atelectatic changes, mild infiltrate in the left lung base cannot be excluded. 3. Cardiomegaly, no pulmonary venous  distention.   Electronically Signed   By: Marcello Moores  Register   On: 12/11/2013 12:39   Ct Angio Chest W/cm &/or Wo Cm  12/11/2013   CLINICAL DATA:  Cough for 2 weeks.  Right side chest pain.  EXAM: CT ANGIOGRAPHY CHEST WITH CONTRAST  TECHNIQUE: Multidetector CT imaging of the chest was performed using the standard protocol during bolus administration of intravenous contrast. Multiplanar CT image reconstructions and MIPs were obtained to evaluate the vascular anatomy.  CONTRAST:  100 mL OMNIPAQUE IOHEXOL 350 MG/ML SOLN  COMPARISON:  PA and lateral chest 12/11/2013.  FINDINGS: No pulmonary embolus is identified. The patient has a moderate right pleural effusion. The superior component of the effusion has a convex border suggesting loculation. There is no left pleural effusion or pericardial effusion. Heart size is normal. No axillary, hilar or mediastinal lymphadenopathy is identified. Extensive right lower lobe airspace disease is present consistent with pneumonia and atelectasis. Mild left basilar atelectasis is noted. Visualized upper abdomen is unremarkable. No focal bony abnormality is identified.  Review of the MIP images confirms the above findings.  IMPRESSION: Negative for pulmonary embolus.  Moderate right pleural effusion the superior portion of which appears loculated. Extensive right lower lobe airspace is consistent with pneumonia and atelectasis.   Electronically Signed   By: Inge Rise M.D.   On: 12/11/2013 15:52    EKG Interpretation   None       MDM   Final diagnoses:  CAP (community acquired pneumonia)  Pleural effusion on right    Patient presents with cough also worsening shortness of breath last night. He has had continued dyspnea. He required increased oxygen level. Has parapneumonic effusion on right. Will have drainage in the ED by critical care. Will be admitted to the ICU.  CRITICAL CARE Performed by: Mackie Pai Total critical care time: 30 Critical care  time was exclusive of separately billable procedures and treating other patients. Critical care was necessary to treat or prevent imminent or life-threatening deterioration. Critical care was time spent personally by me on the following activities: development of treatment plan with patient and/or surrogate as well as nursing, discussions with consultants, evaluation of patient's response to treatment, examination of patient, obtaining history from patient or surrogate, ordering and performing treatments and interventions, ordering and review of laboratory studies, ordering and review of radiographic studies, pulse oximetry and re-evaluation of patient's condition.     Jasper Riling. Alvino Chapel, MD 12/11/13 1622

## 2013-12-11 NOTE — H&P (Signed)
Name: Derek Pennington MRN: [id number] DOB: 1955/10/22    ADMISSION DATE:  12/11/2013 CONSULTATION DATE:  12/11/2013  REFERRING MD :  EDP PRIMARY SERVICE: PCCM  BRIEF PATIENT DESCRIPTION: 58 y.o. M adm to PCCM service with RLL CAP and parapneumonic eff vs empyema. S/P thoracentesis in ED  SIGNIFICANT EVENTS / STUDIES:  2/23 CT chest: RLL consolidation with partially loculated R effusion 2/23 Thoracentesis: LDH 1116, WBC 10,627 with 85% segs  LINES / TUBES:  CULTURES: U.Strep 2/23 >>  U. Legionella 2/23 >>  Pleural fluid 2/23 >>  Blood 2/23 >>   ANTIBIOTICS: Vanc 2/23 X 1 Pip-tazo 2/23 X 1 Azithro 2/23 >>  Ceftriaxone 2/23 >>    HISTORY OF PRESENT ILLNESS:  Derek Pennington is a healthy 58 y.o. M who presents with an on going cough x 3 weeks.  He states that the cough began 3 weeks ago and has progressively gotten worse since it began.  He saw his PCP who treated him for reactive airway disease with a prednisone burst and taper.  Earlier today he saw Dr. Leanne Chang and after his appointment, began to experience significant SOB.  His wife called the office and they instructed her to call 911 since he was so short of breath. He came to the ER and a CXR revealed a RLL infiltrate with a right sided pleural effusion. PCCM was called to evaluate and admit the pt.  A bedside thoracentesis was performed by Dr. Alva Garnet and the pt will be addmited to the ICU. He is currently experiencing some SOB/dyspnea and some mild pleuritic chest pain. He denies N/V/D, fevers/chills/sweats.  PAST MEDICAL HISTORY :  Past Medical History  Diagnosis Date  . Hyperlipidemia   . Testosterone deficiency   . ED (erectile dysfunction)   . Personal history of colonic adenomas 06/07/2013    2009 - diminutive polyp removed not recovered 05/2013 2 diminutive adenomas Repeat colon 2019 Derek Mayer, MD, [facility]    Past Surgical History  Procedure Laterality Date  . Finger fracture surgery      fifth  . Lipoma back   1983   Prior to Admission medications   Medication Sig Start Date End Date Taking? Authorizing Provider  CIALIS 20 MG tablet TAKE 1/2 TO 1 TABLET BY MOUTH EVERY OTHER DAY AS NEEDED FOR ERECTILE DYSFUNCTION 05/25/13  Yes Bruce Kendall Flack, MD  HYDROcodone-homatropine (HYCODAN) 5-1.5 MG/5ML syrup Take 5 mLs by mouth every 8 (eight) hours as needed for cough. 12/06/13  Yes Dorena Cookey, MD  nitroGLYCERIN (NITROSTAT) 0.4 MG SL tablet Place 0.4 mg under the tongue every 5 (five) minutes as needed for chest pain.  04/10/11 12/11/14 Yes Bruce Kendall Flack, MD  predniSONE (DELTASONE) 20 MG tablet Take 20 mg by mouth See admin instructions. Take 1 tab by mouth for 3 days, then take 1/2 tab by mouth for 3 days, then take 1/2 tab on Mondays Wednesdays and Fridays. Patient states he is on his third week on the prednisone which is the (1/2 tab on Monday wed and Friday.)   Yes Historical Provider, MD  simvastatin (ZOCOR) 40 MG tablet Take 1 tablet (40 mg total) by mouth at bedtime. 08/15/12  Yes Lisabeth Pick, MD  Testosterone 30 MG/ACT SOLN Place 1 application onto the skin daily. 10/06/13  Yes Lisabeth Pick, MD   No Known Allergies  FAMILY HISTORY:  Family History  Problem Relation Age of Onset  . Heart disease Mother     stents  .  Leukemia Mother   . Heart attack Father   . Aneurysm Father     AAA  . Colon cancer Neg Hx    SOCIAL HISTORY:  reports that he has quit smoking. He has never used smokeless tobacco. He reports that he drinks about 2.4 ounces of alcohol per week. He reports that he does not use illicit drugs.  REVIEW OF SYSTEMS:  Negative except as stated in the HPI.  SUBJECTIVE:    VITAL SIGNS: Temp:  [98 F (36.7 C)-99.6 F (37.6 C)] 99.6 F (37.6 C) (02/23 1446) Pulse Rate:  [97-116] 116 (02/23 1556) Resp:  [24-29] 26 (02/23 1556) BP: (132-164)/(90-102) 164/102 mmHg (02/23 1556) SpO2:  [93 %-100 %] 97 % (02/23 1556) Weight:  [242 lb (109.77 kg)] 242 lb (109.77 kg) (02/23  1000) HEMODYNAMICS:   VENTILATOR SETTINGS:   INTAKE / OUTPUT: Intake/Output   None     PHYSICAL EXAMINATION: General:  Pleasant, cognition intact, mildly dyspneic @ rest Neuro:  A&O X 3, in NAD. HEENT:  [place]/AT, PERRL. Cardiovascular: Tachy, reg, no M Lungs:  Diminished RLL, + egophany and bronchial breath sounds in RLL to tip of scapula. No pleural friction rub Abdomen:  BS x 4, soft, NT/ND. Ext:  No edema. Skin:  Warm and dry  LABS: I have reviewed all of today's lab results. Relevant abnormalities are discussed in the A/P section  CXR: RLL consolidation with R pleural effusion  ASSESSMENT / PLAN:  PULMONARY A: Acute resp failure RLL CAP Parapneumonic effusion vs empyema P:   Monitor in ICU at least 24 hrs Supplemental O2 to maintain SpO2 > 90% Abx as above Follow CXR - if effusion increases > TCTS fot VATS  CARDIOVASCULAR A: Hypertension P: Monitor Avoid anti-hypertensives if possible in setting of risk of septic shock  RENAL A: Mild AKI (baseline Cr 1.1) P:   Monitor BMET intermittently Monitor I/Os Correct electrolytes as indicated  GASTROINTESTINAL A:  No acute issues. P:   SUP: N/I Advance diet as tolerated.  HEMATOLOGIC A:   Hemoconcentration P:  DVT Px: Enoxaparin/SCD's. Monitor CBC intermittently  INFECTIOUS A:  CAP Parapneumonic eff vs empyema Severe sepsis with marked leukocytosis P:   - PCT algorithm. - Continue antibiotics as ordered. - Trend WBC/fever curve.  ENDOCRINE A:  No acute issues. P:   - Monitor glucose on BMP.  NEUROLOGIC A:  Pleurodynia P:   PRN oxycodone/acetaminophen PRN ketorolac (with caution given mild AKI)   I have personally obtained a history, examined the patient, evaluated laboratory and imaging results, formulated the assessment and plan and placed orders. CRITICAL CARE: The patient is critically ill with multiple organ systems failure and requires high complexity decision making for  assessment and support, frequent evaluation and titration of therapies, application of advanced monitoring technologies and extensive interpretation of multiple databases. Critical Care Time devoted to patient care services described in this note is ___ minutes.   Merton Border, MD ; [facility] [phone].  After 5:30 PM or weekends, call [phone]  Seen with: Montey Hora, [facility] Pgr: [phone]  or [phone]    12/11/2013, 4:22 PM

## 2013-12-11 NOTE — Assessment & Plan Note (Signed)
He has significant cough He has absent breath sounds on the right Suspect pneumonia i won't be surprised if he developed an acute PTX

## 2013-12-11 NOTE — ED Notes (Signed)
Portable chest Xray ordered and notified Xray

## 2013-12-11 NOTE — Assessment & Plan Note (Signed)
Check cxr He clearly has an abnormal pulmonary exam Pulse ox 96%   -- Reviewed cxr- he has pneumonia- avelox Pt to go to ED if he gets worse

## 2013-12-11 NOTE — ED Notes (Signed)
Non productive cough x 2 weeks, no fever or other symptoms noted, pt is on predisone and cough syrup, last pm had increase in SOB, pt speaking in short phrases, labored and per ems tripoding.

## 2013-12-12 ENCOUNTER — Encounter (HOSPITAL_COMMUNITY): Payer: Self-pay | Admitting: Physician Assistant

## 2013-12-12 ENCOUNTER — Inpatient Hospital Stay (HOSPITAL_COMMUNITY): Payer: BC Managed Care – PPO

## 2013-12-12 DIAGNOSIS — R0602 Shortness of breath: Secondary | ICD-10-CM

## 2013-12-12 DIAGNOSIS — J90 Pleural effusion, not elsewhere classified: Secondary | ICD-10-CM

## 2013-12-12 LAB — TYPE AND SCREEN
ABO/RH(D): O NEG
Antibody Screen: NEGATIVE

## 2013-12-12 LAB — BLOOD GAS, ARTERIAL
Acid-Base Excess: 1.9 mmol/L (ref 0.0–2.0)
Bicarbonate: 26.1 mEq/L — ABNORMAL HIGH (ref 20.0–24.0)
Drawn by: 39899
FIO2: 0.21 %
O2 Saturation: 88.9 %
Patient temperature: 98.6
TCO2: 27.4 mmol/L (ref 0–100)
pCO2 arterial: 41.8 mmHg (ref 35.0–45.0)
pH, Arterial: 7.412 (ref 7.350–7.450)
pO2, Arterial: 53.4 mmHg — ABNORMAL LOW (ref 80.0–100.0)

## 2013-12-12 LAB — COMPREHENSIVE METABOLIC PANEL
ALT: 19 U/L (ref 0–53)
AST: 14 U/L (ref 0–37)
Albumin: 2.5 g/dL — ABNORMAL LOW (ref 3.5–5.2)
Alkaline Phosphatase: 103 U/L (ref 39–117)
BUN: 15 mg/dL (ref 6–23)
CO2: 29 mEq/L (ref 19–32)
Calcium: 8.8 mg/dL (ref 8.4–10.5)
Chloride: 94 mEq/L — ABNORMAL LOW (ref 96–112)
Creatinine, Ser: 1.32 mg/dL (ref 0.50–1.35)
GFR calc Af Amer: 68 mL/min — ABNORMAL LOW (ref >90)
GFR calc non Af Amer: 58 mL/min — ABNORMAL LOW (ref >90)
Glucose, Bld: 130 mg/dL — ABNORMAL HIGH (ref 70–99)
Potassium: 4.2 mEq/L (ref 3.7–5.3)
Sodium: 134 mEq/L — ABNORMAL LOW (ref 137–147)
Total Bilirubin: 0.8 mg/dL (ref 0.3–1.2)
Total Protein: 7.4 g/dL (ref 6.0–8.3)

## 2013-12-12 LAB — CBC
HCT: 41.8 % (ref 39.0–52.0)
HCT: 43.3 % (ref 39.0–52.0)
HEMOGLOBIN: 14.3 g/dL (ref 13.0–17.0)
Hemoglobin: 15 g/dL (ref 13.0–17.0)
MCH: 29.3 pg (ref 26.0–34.0)
MCH: 29.5 pg (ref 26.0–34.0)
MCHC: 34.2 g/dL (ref 30.0–36.0)
MCHC: 34.6 g/dL (ref 30.0–36.0)
MCV: 85.7 fL (ref 78.0–100.0)
MCV: 85.1 fL (ref 78.0–100.0)
Platelets: 218 10*3/uL (ref 150–400)
Platelets: 222 10*3/uL (ref 150–400)
RBC: 4.88 MIL/uL (ref 4.22–5.81)
RBC: 5.09 MIL/uL (ref 4.22–5.81)
RDW: 13.7 % (ref 11.5–15.5)
RDW: 13.5 % (ref 11.5–15.5)
WBC: 35.4 10*3/uL — ABNORMAL HIGH (ref 4.0–10.5)
WBC: 30.7 10*3/uL — ABNORMAL HIGH (ref 4.0–10.5)

## 2013-12-12 LAB — URINE MICROSCOPIC-ADD ON

## 2013-12-12 LAB — GLUCOSE, CAPILLARY
GLUCOSE-CAPILLARY: 146 mg/dL — AB (ref 70–99)
GLUCOSE-CAPILLARY: 163 mg/dL — AB (ref 70–99)
Glucose-Capillary: 143 mg/dL — ABNORMAL HIGH (ref 70–99)

## 2013-12-12 LAB — URINALYSIS, ROUTINE W REFLEX MICROSCOPIC
Glucose, UA: NEGATIVE mg/dL
Hgb urine dipstick: NEGATIVE
Ketones, ur: NEGATIVE mg/dL
Leukocytes, UA: NEGATIVE
Nitrite: NEGATIVE
Protein, ur: 100 mg/dL — AB
Specific Gravity, Urine: 1.026 (ref 1.005–1.030)
Urobilinogen, UA: 1 mg/dL (ref 0.0–1.0)
pH: 5.5 (ref 5.0–8.0)

## 2013-12-12 LAB — APTT: aPTT: 34 seconds (ref 24–37)

## 2013-12-12 LAB — PROCALCITONIN
PROCALCITONIN: 4.19 ng/mL
Procalcitonin: 3.57 ng/mL

## 2013-12-12 LAB — BASIC METABOLIC PANEL
BUN: 18 mg/dL (ref 6–23)
CHLORIDE: 93 meq/L — AB (ref 96–112)
CO2: 27 mEq/L (ref 19–32)
CREATININE: 1.44 mg/dL — AB (ref 0.50–1.35)
Calcium: 8.5 mg/dL (ref 8.4–10.5)
GFR calc Af Amer: 61 mL/min — ABNORMAL LOW (ref >90)
GFR calc non Af Amer: 53 mL/min — ABNORMAL LOW (ref >90)
GLUCOSE: 145 mg/dL — AB (ref 70–99)
POTASSIUM: 4.3 meq/L (ref 3.7–5.3)
Sodium: 133 mEq/L — ABNORMAL LOW (ref 137–147)

## 2013-12-12 LAB — PROTIME-INR
INR: 1.06 (ref 0.00–1.49)
Prothrombin Time: 13.6 seconds (ref 11.6–15.2)

## 2013-12-12 LAB — LEGIONELLA ANTIGEN, URINE: Legionella Antigen, Urine: NEGATIVE

## 2013-12-12 MED ORDER — GUAIFENESIN 100 MG/5ML PO SYRP
200.0000 mg | ORAL_SOLUTION | ORAL | Status: DC | PRN
  Filled 2013-12-12: qty 10

## 2013-12-12 NOTE — Consult Note (Signed)
[address]       [name] [address]             [id number]          Derek Pennington [place] Medical Record #[id number] Date of Birth: 1955-11-07  Referring: Dr Elsworth Soho Primary Care: Chancy Hurter, MD  Chief Complaint:    Chief Complaint  Patient presents with  . Shortness of Breath    History of Present Illness:     The patient is a 58 year old male who was in his usual state of health until approximately 3 weeks ago, when he developed a cough.  This was dry and nonproductive, and occurred episodically. He saw his primary MD, was diagnosed with reactive airway disease and was treated with steroids. His cough persisted and worsened, and he developed shortness of breath.  He denied fever, chills, sputum production or hemoptysis. While he was away on a [employer] trip this weekend, he had a prolonged coughing episode following a hike.  Later that evening, his dyspnea worsened to the point that he could not lie flat.  He contacted his primary MD and was told to come in on Monday for a recheck.  He was seen the following morning, and at that time complained of severe right sided chest pain associated with coughing.  A chest x-ray was ordered.  The patient was sent home to wait for results of the x-ray, and during that time, he developed severe dyspnea and chest pain that was so concerning that his wife called EMS. He was brought to the ER at [facility] for evaluation.  A chest x-ray in the ER revealed a parapneumonic effusion. Pulmonary/Critical Care was consulted and a right thoracentesis was performed in the ER yielding 250 ml of turbid fluid.  He was then admitted for antibiotic therapy.   Since admission, he has continued to have severe right sided chest pain and cough. A CT of the chest was performed, which showed a loculated right effusion.  At this time, TCTS has been consulted for consideration of a right VATS for drainage of the effusion.    Past Medical  History: Past Medical History  Diagnosis Date  . Hyperlipidemia   . Testosterone deficiency   . ED (erectile dysfunction)   . Personal history of colonic adenomas 06/07/2013    2009 - diminutive polyp removed not recovered 05/2013 2 diminutive adenomas Repeat colon 2019 [name], MD, [facility]      Past Surgical History: Past Surgical History  Procedure Laterality Date  . Finger fracture surgery      fifth  . Lipoma back  1983    Social History: History   Social History  . Marital Status: Married    Spouse Name: N/A    Number of Children: N/A  . Years of Education: N/A   Occupational History  . Toxicologist   Social History Main Topics  . Smoking status: Former Research scientist (life sciences)  . Smokeless tobacco: Never Used  . Alcohol Use: 2.4 oz/week    4 Glasses of wine per week  . Drug Use: No  . Sexual Activity: Not on file   Other Topics Concern  . Not on file   Social History Narrative  . No narrative on file    Allergies: No Known Allergies   Medications: Current Facility-Administered Medications  Medication Dose Route Frequency Provider Last Rate Last Dose  . 0.9 %  sodium chloride infusion  250 mL Intravenous  PRN Jasper Riling. Pickering, MD      . azithromycin (ZITHROMAX) 500 mg in dextrose 5 % 250 mL IVPB  500 mg Intravenous Q24H Wilhelmina Mcardle, MD   500 mg at 12/11/13 2035  . cefTRIAXone (ROCEPHIN) 1 g in dextrose 5 % 50 mL IVPB  1 g Intravenous Q24H Wilhelmina Mcardle, MD   1 g at 12/12/13 0032  . dextrose 5 %-0.45 % sodium chloride infusion   Intravenous Continuous Jasper Riling. Pickering, MD 100 mL/hr at 12/11/13 1840 1,000 mL at 12/11/13 1840  . enoxaparin (LOVENOX) injection 40 mg  40 mg Subcutaneous Q24H Nathan R. Pickering, MD   40 mg at 12/11/13 1851  . ketorolac (TORADOL) 30 MG/ML injection 30 mg  30 mg Intravenous Q6H PRN Jasper Riling. Pickering, MD   30 mg at 12/12/13 0741  . oxyCODONE-acetaminophen (PERCOCET/ROXICET) 5-325 MG per tablet 1 tablet  1 tablet Oral Q4H PRN  Wilhelmina Mcardle, MD   1 tablet at 12/12/13 1135   Home Medications: Prescriptions prior to admission  Medication Sig Dispense Refill  . CIALIS 20 MG tablet TAKE 1/2 TO 1 TABLET BY MOUTH EVERY OTHER DAY AS NEEDED FOR ERECTILE DYSFUNCTION  4 tablet  0  . HYDROcodone-homatropine (HYCODAN) 5-1.5 MG/5ML syrup Take 5 mLs by mouth every 8 (eight) hours as needed for cough.  120 mL  0  . nitroGLYCERIN (NITROSTAT) 0.4 MG SL tablet Place 0.4 mg under the tongue every 5 (five) minutes as needed for chest pain.       . predniSONE (DELTASONE) 20 MG tablet Take 20 mg by mouth See admin instructions. Take 1 tab by mouth for 3 days, then take 1/2 tab by mouth for 3 days, then take 1/2 tab on Mondays Wednesdays and Fridays. Patient states he is on his third week on the prednisone which is the (1/2 tab on Monday wed and Friday.)      . simvastatin (ZOCOR) 40 MG tablet Take 1 tablet (40 mg total) by mouth at bedtime.  90 tablet  2  . Testosterone 30 MG/ACT SOLN Place 1 application onto the skin daily.  90 mL  5     Family History: Family History  Problem Relation Age of Onset  . Heart disease Mother     stents  . Leukemia Mother   . Heart attack Father   . Aneurysm Father     AAA  . Colon cancer Neg Hx   Father deceased -> fungal infection   Review of Systems:     Cardiac Review of Systems: Y or N  Chest Pain [  X- right sided  ]  Resting SOB [ x  ] Exertional SOB  [ x ]  Orthopnea [ x ]   Pedal Edema [   ]    Palpitations [  ] Syncope  [  ]   Presyncope [   ]   General Review of Systems:  Constitional: recent weight change [  ]; anorexia [  ]; fatigue [  ]; nausea [  ]; night sweats [ x ]; fever [  ]; or chills [  ];  Dental: poor dentition[  ]; Eye : blurred vision [  ]; diplopia [   ]; vision changes [  ];  Amaurosis fugax[  ]; Resp: cough [x  ];  wheezing[  ];  hemoptysis[   ]; shortness of breath[ x ]; paroxysmal nocturnal dyspnea[  ]; dyspnea on exertion[ x ]; or orthopnea[ x ];  GI:  gallstones[  ], vomiting[  ];  dysphagia[  ]; melena[  ];  hematochezia [  ]; heartburn[  ];   Hx of  Colonoscopy[  ]; GU: kidney stones [  ]; hematuria[  ];   dysuria [  ];  nocturia[  ];  history of     obstruction [  ];             Skin: rash, swelling[  ];, hair loss[  ];  peripheral edema[  ];  or itching[  ];History of lipomas Musculosketetal: myalgias[ x- right sided rib pain ];  joint swelling[  ];  joint erythema[  ];  joint pain[  ];  back pain[  ];  Heme/Lymph: bruising[  ];  bleeding[  ];  anemia[  ];  Neuro: TIA[  ];  headaches[  ];  stroke[  ];  vertigo[  ];  seizures[  ];   paresthesias[  ];  difficulty walking[  ];  Psych:depression[  ]; anxiety[  ];  Endocrine: diabetes[  ];  thyroid dysfunction[  ];  Immunizations: Flu [  ]; Pneumococcal[  ];   Other: Patient had an episode of chest pain when hiking several years ago and underwent a stress test by his primary MD- was not told he had heart problems, but was given a Rx for nitroglycerin    Physical Exam: BP 131/78  Pulse 99  Temp(Src) 98.9 F (37.2 C) (Oral)  Resp 18  Ht 5' 10.87" (1.8 m)  Wt 240 lb 15.4 oz (109.3 kg)  BMI 33.73 kg/m2  SpO2 98%  General appearance: alert, cooperative and no distress Neurologic: intact Heart: tachycardic 110-120s Lungs: Decreased BS 1/3 way up on the right, clear on left.  No wheezes, rales or rhonchi. Abdomen: soft, non-tender; bowel sounds normal; no masses,  no organomegaly Extremities: extremities normal, atraumatic, no cyanosis or edema     Diagnostic Studies & Laboratory data:     Recent Radiology Findings:   Dg Chest 2 View  12/11/2013   CLINICAL DATA:  Fall.  EXAM: CHEST  2 VIEW  COMPARISON:  None.  FINDINGS: Right lower lobe infiltrate with right pleural effusion noted. Pleural fluid noted in the minor fissure. Basilar atelectasis. Possible infiltrate left  lung base noted. Cardiomegaly. No pulmonary venous congestion. No acute osseous abnormality.  IMPRESSION: 1. Right lower lobe infiltrate consistent with pneumonia. Associated right pleural effusion is present with fluid in the minor fissure. 2. Left base atelectatic changes, mild infiltrate in the left lung base cannot be excluded. 3. Cardiomegaly, no pulmonary venous distention.   Electronically Signed   By: Marcello Moores  Register   On: 12/11/2013 12:39   Ct Angio Chest W/cm &/or Wo Cm  12/11/2013   CLINICAL DATA:  Cough for 2 weeks.  Right side chest pain.  EXAM: CT ANGIOGRAPHY CHEST WITH CONTRAST  TECHNIQUE: Multidetector CT imaging of the chest was performed using the standard protocol during bolus administration of intravenous contrast. Multiplanar CT image reconstructions and MIPs were obtained to evaluate the vascular anatomy.  CONTRAST:  100 mL OMNIPAQUE IOHEXOL 350 MG/ML SOLN  COMPARISON:  PA and lateral chest 12/11/2013.  FINDINGS: No pulmonary embolus is identified. The patient has a moderate right pleural effusion. The superior component of the effusion has a convex border suggesting loculation. There is no left pleural effusion or pericardial effusion. Heart size is normal. No axillary, hilar or mediastinal lymphadenopathy is identified. Extensive right lower lobe airspace disease is present consistent with pneumonia and atelectasis. Mild left basilar atelectasis is noted. Visualized upper abdomen is unremarkable. No focal bony abnormality is identified.  Review of the MIP images confirms the above findings.  IMPRESSION: Negative for pulmonary embolus.  Moderate right pleural effusion the superior portion of which appears loculated. Extensive right lower lobe airspace is consistent with pneumonia and atelectasis.   Electronically Signed   By: Inge Rise M.D.   On: 12/11/2013 15:52   Dg Chest Port 1 View  12/12/2013   CLINICAL DATA:  Shortness of breath, pleural effusion  EXAM: PORTABLE CHEST - 1  VIEW  COMPARISON:  Portable exam 0502 hr compared to 12/11/2013  FINDINGS: Enlargement of cardiac silhouette.  Mediastinal contours and pulmonary vascularity normal.  Persistent right pleural effusion with atelectasis versus consolidation at right lung base.  Tiny left pleural effusion.  No pneumothorax.  Bones unremarkable.  IMPRESSION: Enlargement of cardiac silhouette with persistent right pleural effusion and basilar atelectasis versus consolidation.   Electronically Signed   By: Lavonia Dana M.D.   On: 12/12/2013 08:01   Dg Chest Portable 1 View  12/11/2013   CLINICAL DATA:  Cough, shortness of Breath  EXAM: PORTABLE CHEST - 1 VIEW  COMPARISON:  12/11/2013  FINDINGS: Cardiomegaly is noted. There is moderate right pleural effusion with right lower lobe atelectasis or infiltrate. No pulmonary edema.  IMPRESSION: Moderate right pleural effusion with right lower lobe atelectasis or infiltrate. No pulmonary edema. Follow-up examination to assure resolution is recommended.   Electronically Signed   By: Lahoma Crocker M.D.   On: 12/11/2013 17:10       Recent Lab Findings: Lab Results  Component Value Date   WBC 35.4* 12/12/2013   HGB 14.3 12/12/2013   HCT 41.8 12/12/2013   PLT 218 12/12/2013   GLUCOSE 145* 12/12/2013   CHOL 158 08/12/2012   TRIG 134.0 08/12/2012   HDL 40.50 08/12/2012   LDLDIRECT 198.4 10/16/2009   LDLCALC 91 08/12/2012   ALT 24 08/12/2012   AST 22 08/12/2012   NA 133* 12/12/2013   K 4.3 12/12/2013   CL 93* 12/12/2013   CREATININE 1.44* 12/12/2013   BUN 18 12/12/2013   CO2 27 12/12/2013   TSH 1.66 08/12/2012   Blood cultures negative Fluid culture few gram positive cocci in chains   Assessment / Plan:   The patient is a 58 year old male with no significant past medical history who presents with a right pneumonia and loculated right effusion/empyema.  He underwent a thoracentesis yielding 250 ml fluid, but there remains a loculated fluid collection on CT scan. He also has a  persistent leukocytosis, and a mild renal insufficiency.   I have recommended proceeding with right VATS/ decortication and bronchoscopy. Patient angry   why this was not done yesterday. I assured him we will proceed first thing in am, and after he has been NPO.  The goals risks and alternatives of the planned surgical procedure Bronchoscopy, right VATS decortication drainage of empyema   have been discussed with the patient in detail. The risks of the procedure including death, infection, stroke, myocardial infarction, bleeding, blood transfusion have all been discussed specifically.  I have  quoted Derek Pennington a 2 % of perioperative mortality and a complication rate as high as 20 %. The patient's questions have been answered.Derek Pennington is willing  to proceed with the planned procedure.  I have seen and examined Derek Pennington and agree with the above assessment  and plan.  Grace Isaac MD [facility] [phone] Office [phone] 12/12/2013 5:46 PM

## 2013-12-12 NOTE — Progress Notes (Signed)
Name: Derek Pennington MRN: [id number] DOB: [birth year]    ADMISSION DATE:  12/11/2013 CONSULTATION DATE:  12/11/2013  REFERRING MD :  EDP PRIMARY SERVICE: PCCM  CHIEF COMPLAINT:  Shortness of breath  BRIEF PATIENT DESCRIPTION: 58 y.o. M adm to PCCM service with RLL CAP and parapneumonic eff vs empyema. S/P thoracentesis in ED  SIGNIFICANT EVENTS / STUDIES:  2/23 CT chest: RLL consolidation with partially loculated R effusion  2/23 Thoracentesis: LDH 1116, WBC 10,627 with 85% segs  LINES / TUBES: none  CULTURES: U.Strep 2/23 >>  U. Legionella 2/23 >>  Pleural fluid 2/23 >> few GPC in chains, abundant WBC Blood 2/23 >>   ANTIBIOTICS: Vanc 2/23 X 1  Pip-tazo 2/23 X 1  Azithro 2/23 >>  Ceftriaxone 2/23 >>   SUBJECTIVE: has improved rt pleuritic chest pain post-pain medications.  No change in breathing status per patient. afebrile  VITAL SIGNS: Temp:  [97.9 F (36.6 C)-99.6 F (37.6 C)] 97.9 F (36.6 C) (02/24 0425) Pulse Rate:  [81-126] 96 (02/24 0800) Resp:  [14-35] 26 (02/24 0800) BP: (100-164)/(70-107) 126/77 mmHg (02/24 0800) SpO2:  [93 %-100 %] 98 % (02/24 0800) Weight:  [109.3 kg (240 lb 15.4 oz)-109.8 kg (242 lb 1 oz)] 109.3 kg (240 lb 15.4 oz) (02/24 0500) HEMODYNAMICS:   VENTILATOR SETTINGS:   INTAKE / OUTPUT: Intake/Output     02/23 0701 - 02/24 0700 02/24 0701 - 02/25 0700   I.V. (mL/kg) 1233.3 (11.3)    IV Piggyback 800    Total Intake(mL/kg) 2033.3 (18.6)    Urine (mL/kg/hr) 300    Other 275    Total Output 575     Net +1458.3            PHYSICAL EXAMINATION: General: Pleasant, cognition intact  Neuro: Alert, answers questions appropriately  HEENT: [place]/AT, [place] in place  Cardiovascular: Tachy, reg, no M  Lungs: Diminished RLL, no crackles, no wheezes, no increased work or breathing Abdomen: BS, soft, NT/ND.  Ext: No edema.  Skin: Warm and dry   LABS:  CBC  Recent Labs Lab 12/11/13 1452 12/11/13 1532 12/12/13 0220  WBC 38.3*  --   35.4*  HGB 16.3 17.7* 14.3  HCT 46.3 52.0 41.8  PLT 262  --  218   Coag's No results found for this basename: APTT, INR,  in the last 168 hours BMET  Recent Labs Lab 12/11/13 1532 12/12/13 0220  NA 136* 133*  K 4.5 4.3  CL 98 93*  CO2  --  27  BUN 20 18  CREATININE 1.30 1.44*  GLUCOSE 135* 145*   Electrolytes  Recent Labs Lab 12/12/13 0220  CALCIUM 8.5   Sepsis Markers  Recent Labs Lab 12/11/13 1642 12/12/13 0220  PROCALCITON 3.00 4.19   ABG No results found for this basename: PHART, PCO2ART, PO2ART,  in the last 168 hours Liver Enzymes No results found for this basename: AST, ALT, ALKPHOS, BILITOT, ALBUMIN,  in the last 168 hours Cardiac Enzymes No results found for this basename: TROPONINI, PROBNP,  in the last 168 hours Glucose  Recent Labs Lab 12/11/13 1809 12/12/13 12/12/13 0424  GLUCAP 110* 146* 163*    Imaging Dg Chest 2 View  12/11/2013   CLINICAL DATA:  Fall.  EXAM: CHEST  2 VIEW  COMPARISON:  None.  FINDINGS: Right lower lobe infiltrate with right pleural effusion noted. Pleural fluid noted in the minor fissure. Basilar atelectasis. Possible infiltrate left lung base noted. Cardiomegaly. No pulmonary venous congestion. No acute  osseous abnormality.  IMPRESSION: 1. Right lower lobe infiltrate consistent with pneumonia. Associated right pleural effusion is present with fluid in the minor fissure. 2. Left base atelectatic changes, mild infiltrate in the left lung base cannot be excluded. 3. Cardiomegaly, no pulmonary venous distention.   Electronically Signed   By: Marcello Moores  Register   On: 12/11/2013 12:39   Ct Angio Chest W/cm &/or Wo Cm  12/11/2013   CLINICAL DATA:  Cough for 2 weeks.  Right side chest pain.  EXAM: CT ANGIOGRAPHY CHEST WITH CONTRAST  TECHNIQUE: Multidetector CT imaging of the chest was performed using the standard protocol during bolus administration of intravenous contrast. Multiplanar CT image reconstructions and MIPs were obtained to  evaluate the vascular anatomy.  CONTRAST:  100 mL OMNIPAQUE IOHEXOL 350 MG/ML SOLN  COMPARISON:  PA and lateral chest 12/11/2013.  FINDINGS: No pulmonary embolus is identified. The patient has a moderate right pleural effusion. The superior component of the effusion has a convex border suggesting loculation. There is no left pleural effusion or pericardial effusion. Heart size is normal. No axillary, hilar or mediastinal lymphadenopathy is identified. Extensive right lower lobe airspace disease is present consistent with pneumonia and atelectasis. Mild left basilar atelectasis is noted. Visualized upper abdomen is unremarkable. No focal bony abnormality is identified.  Review of the MIP images confirms the above findings.  IMPRESSION: Negative for pulmonary embolus.  Moderate right pleural effusion the superior portion of which appears loculated. Extensive right lower lobe airspace is consistent with pneumonia and atelectasis.   Electronically Signed   By: Inge Rise M.D.   On: 12/11/2013 15:52   Dg Chest Port 1 View  12/12/2013   CLINICAL DATA:  Shortness of breath, pleural effusion  EXAM: PORTABLE CHEST - 1 VIEW  COMPARISON:  Portable exam 0502 hr compared to 12/11/2013  FINDINGS: Enlargement of cardiac silhouette.  Mediastinal contours and pulmonary vascularity normal.  Persistent right pleural effusion with atelectasis versus consolidation at right lung base.  Tiny left pleural effusion.  No pneumothorax.  Bones unremarkable.  IMPRESSION: Enlargement of cardiac silhouette with persistent right pleural effusion and basilar atelectasis versus consolidation.   Electronically Signed   By: Lavonia Dana M.D.   On: 12/12/2013 08:01   Dg Chest Portable 1 View  12/11/2013   CLINICAL DATA:  Cough, shortness of Breath  EXAM: PORTABLE CHEST - 1 VIEW  COMPARISON:  12/11/2013  FINDINGS: Cardiomegaly is noted. There is moderate right pleural effusion with right lower lobe atelectasis or infiltrate. No pulmonary  edema.  IMPRESSION: Moderate right pleural effusion with right lower lobe atelectasis or infiltrate. No pulmonary edema. Follow-up examination to assure resolution is recommended.   Electronically Signed   By: Lahoma Crocker M.D.   On: 12/11/2013 17:10    CXR: continued right pleural effusion  ASSESSMENT / PLAN:  PULMONARY  A:  Acute resp failure  RLL CAP  Parapneumonic effusion vs empyema (pos G stain & purulent appearance argues for empyema) s/p drainage of 250 cc P:   Supplemental O2 to maintain SpO2 > 90%  Abx as above  Consider c/s TCTS for VATS given empyema  CARDIOVASCULAR  A:  Hypertension  HLD P:  Monitor  Avoid anti-hypertensives if possible in setting of risk of septic shock  Continue home simvastatin  RENAL  A:  Mild AKI (baseline Cr 1.1)  P:  Monitor BMET   Monitor I/Os  Correct electrolytes as indicated   GASTROINTESTINAL  A:  No acute issues.  P:  Advance  diet as tolerated.   HEMATOLOGIC  A:  Hemoconcentration  P:  DVT Px: Enoxaparin/SCD's.  Monitor CBC  INFECTIOUS  A:  CAP  Parapneumonic eff vs empyema  Severe sepsis with marked leukocytosis  P:  - PCT algorithm. 4.19 today - Continue antibiotics as ordered.  - Trend WBC/fever curve.  - f/u cultures - will c/s TCTS for potential drainage of empyema  ENDOCRINE  A:  No acute issues.  P:  - Monitor glucose on BMP.   NEUROLOGIC  A:  Pain P:  PRN oxycodone/acetaminophen  PRN ketorolac (with caution given mild AKI) PRN fentanyl  TODAY'S SUMMARY: Await TCTS input - chest tube vs VATs for rt empyema  I have personally obtained a history, examined the patient, evaluated laboratory and imaging results, formulated the assessment and plan and placed orders.   Rigoberto Noel  Pulmonary and [place] Pager: [phone]  12/12/2013, 8:56 AM

## 2013-12-12 NOTE — Progress Notes (Signed)
eLink Physician-Brief Progress Note Patient Name: Derek Pennington DOB: [birth year] MRN: [id number]  Date of Service  12/12/2013   HPI/Events of Note   Pt c/o cough.  eICU Interventions   Will order prn robitussin.   Intervention Category Minor Interventions: Other:  [name] 12/12/2013, 8:27 PM

## 2013-12-13 ENCOUNTER — Inpatient Hospital Stay (HOSPITAL_COMMUNITY): Payer: BC Managed Care – PPO | Admitting: Anesthesiology

## 2013-12-13 ENCOUNTER — Encounter (HOSPITAL_COMMUNITY)
Admission: EM | Disposition: A | Discharge status: Home / Self Care | Payer: Self-pay | Source: Home / Self Care | Attending: Pulmonary Disease

## 2013-12-13 ENCOUNTER — Encounter (HOSPITAL_COMMUNITY): Payer: Self-pay | Admitting: Anesthesiology

## 2013-12-13 ENCOUNTER — Inpatient Hospital Stay (HOSPITAL_COMMUNITY): Payer: BC Managed Care – PPO

## 2013-12-13 ENCOUNTER — Encounter (HOSPITAL_COMMUNITY): Payer: BC Managed Care – PPO | Admitting: Anesthesiology

## 2013-12-13 DIAGNOSIS — J869 Pyothorax without fistula: Secondary | ICD-10-CM | POA: Diagnosis not present

## 2013-12-13 HISTORY — PX: PLEURAL EFFUSION DRAINAGE: SHX5099

## 2013-12-13 HISTORY — PX: VIDEO BRONCHOSCOPY: SHX5072

## 2013-12-13 HISTORY — PX: VIDEO ASSISTED THORACOSCOPY: SHX5073

## 2013-12-13 HISTORY — PX: DECORTICATION: SHX5101

## 2013-12-13 LAB — GLUCOSE, SEROUS FLUID: Glucose, Fluid: <20 mg/dL

## 2013-12-13 LAB — BODY FLUID CELL COUNT WITH DIFFERENTIAL
Eos, Fluid: 0 %
LYMPHS FL: 1 %
Monocyte-Macrophage-Serous Fluid: 2 % — ABNORMAL LOW (ref 50–90)
NEUTROPHIL FLUID: 97 % — AB (ref 0–25)
Total Nucleated Cell Count, Fluid: 3500 cu mm — ABNORMAL HIGH (ref 0–1000)

## 2013-12-13 LAB — ABO/RH: ABO/RH(D): O NEG

## 2013-12-13 LAB — PATHOLOGIST SMEAR REVIEW: PATH REVIEW: REACTIVE

## 2013-12-13 LAB — PROTEIN, BODY FLUID: Total protein, fluid: 5.8 g/dL

## 2013-12-13 SURGERY — VIDEO ASSISTED THORACOSCOPY
Anesthesia: General | Site: Chest | Laterality: Right

## 2013-12-13 MED ORDER — ESMOLOL HCL 10 MG/ML IV SOLN
INTRAVENOUS | Status: DC | PRN
  Administered 2013-12-13 (×2): 20 mg via INTRAVENOUS

## 2013-12-13 MED ORDER — ACETAMINOPHEN 160 MG/5ML PO SOLN: 1000.0000 mg | Freq: Four times a day (QID) | ORAL | Status: AC

## 2013-12-13 MED ORDER — LACTATED RINGERS IV SOLN: INTRAVENOUS | Status: DC

## 2013-12-13 MED ORDER — PROPOFOL 10 MG/ML IV BOLUS
INTRAVENOUS | Status: AC
  Filled 2013-12-13: qty 20

## 2013-12-13 MED ORDER — BISACODYL 5 MG PO TBEC
10.0000 mg | DELAYED_RELEASE_TABLET | Freq: Every day | ORAL | Status: DC
  Administered 2013-12-14 – 2013-12-18 (×4): 10 mg via ORAL
  Filled 2013-12-13 (×3): qty 2

## 2013-12-13 MED ORDER — VECURONIUM BROMIDE 10 MG IV SOLR
INTRAVENOUS | Status: DC | PRN
  Administered 2013-12-13 (×2): 2 mg via INTRAVENOUS
  Administered 2013-12-13: 3 mg via INTRAVENOUS

## 2013-12-13 MED ORDER — OXYCODONE-ACETAMINOPHEN 5-325 MG PO TABS
1.0000 | ORAL_TABLET | ORAL | Status: DC | PRN
  Administered 2013-12-14 – 2013-12-18 (×17): 2 via ORAL
  Filled 2013-12-13 (×17): qty 2

## 2013-12-13 MED ORDER — OXYCODONE HCL 5 MG PO TABS
5.0000 mg | ORAL_TABLET | ORAL | Status: AC | PRN
  Administered 2013-12-13 – 2013-12-14 (×2): 5 mg via ORAL
  Filled 2013-12-13 (×2): qty 1

## 2013-12-13 MED ORDER — FENTANYL CITRATE 0.05 MG/ML IJ SOLN
INTRAMUSCULAR | Status: AC
  Filled 2013-12-13: qty 5

## 2013-12-13 MED ORDER — SIMVASTATIN 40 MG PO TABS
40.0000 mg | ORAL_TABLET | Freq: Every day | ORAL | Status: DC
  Administered 2013-12-13 – 2013-12-17 (×4): 40 mg via ORAL
  Filled 2013-12-13 (×7): qty 1

## 2013-12-13 MED ORDER — GLYCOPYRROLATE 0.2 MG/ML IJ SOLN
INTRAMUSCULAR | Status: DC | PRN
  Administered 2013-12-13: .8 mg via INTRAVENOUS

## 2013-12-13 MED ORDER — 0.9 % SODIUM CHLORIDE (POUR BTL) OPTIME
TOPICAL | Status: DC | PRN
  Administered 2013-12-13: 2000 mL

## 2013-12-13 MED ORDER — NALOXONE HCL 0.4 MG/ML IJ SOLN: 0.4000 mg | INTRAMUSCULAR | Status: DC | PRN

## 2013-12-13 MED ORDER — ACETAMINOPHEN 500 MG PO TABS
1000.0000 mg | ORAL_TABLET | Freq: Four times a day (QID) | ORAL | Status: AC
  Administered 2013-12-13 – 2013-12-14 (×3): 1000 mg via ORAL
  Filled 2013-12-13 (×3): qty 2

## 2013-12-13 MED ORDER — NEOSTIGMINE METHYLSULFATE 1 MG/ML IJ SOLN
INTRAMUSCULAR | Status: DC | PRN
  Administered 2013-12-13: 5 mg via INTRAVENOUS

## 2013-12-13 MED ORDER — POTASSIUM CHLORIDE 10 MEQ/50ML IV SOLN
10.0000 meq | Freq: Every day | INTRAVENOUS | Status: DC | PRN
  Filled 2013-12-13: qty 50

## 2013-12-13 MED ORDER — TRAMADOL HCL 50 MG PO TABS
50.0000 mg | ORAL_TABLET | Freq: Four times a day (QID) | ORAL | Status: DC | PRN
  Administered 2013-12-14: 50 mg via ORAL
  Administered 2013-12-16 – 2013-12-17 (×4): 100 mg via ORAL
  Filled 2013-12-13: qty 2
  Filled 2013-12-13: qty 1
  Filled 2013-12-13 (×4): qty 2

## 2013-12-13 MED ORDER — MIDAZOLAM HCL 2 MG/2ML IJ SOLN
INTRAMUSCULAR | Status: AC
  Filled 2013-12-13: qty 2

## 2013-12-13 MED ORDER — SODIUM CHLORIDE 0.9 % IJ SOLN: 9.0000 mL | INTRAMUSCULAR | Status: DC | PRN

## 2013-12-13 MED ORDER — KCL IN DEXTROSE-NACL 20-5-0.45 MEQ/L-%-% IV SOLN
INTRAVENOUS | Status: DC
  Administered 2013-12-13: 100 mL/h via INTRAVENOUS
  Administered 2013-12-14: 04:00:00 via INTRAVENOUS
  Filled 2013-12-13 (×3): qty 1000

## 2013-12-13 MED ORDER — ATROPINE SULFATE 0.1 MG/ML IJ SOLN
INTRAMUSCULAR | Status: AC
  Filled 2013-12-13: qty 10

## 2013-12-13 MED ORDER — ESMOLOL HCL 10 MG/ML IV SOLN
INTRAVENOUS | Status: AC
  Filled 2013-12-13: qty 10

## 2013-12-13 MED ORDER — ROCURONIUM BROMIDE 100 MG/10ML IV SOLN
INTRAVENOUS | Status: DC | PRN
  Administered 2013-12-13: 50 mg via INTRAVENOUS

## 2013-12-13 MED ORDER — DIPHENHYDRAMINE HCL 50 MG/ML IJ SOLN: 12.5000 mg | Freq: Four times a day (QID) | INTRAMUSCULAR | Status: DC | PRN

## 2013-12-13 MED ORDER — NEOSTIGMINE METHYLSULFATE 1 MG/ML IJ SOLN
INTRAMUSCULAR | Status: AC
Start: 2013-12-13 — End: 2013-12-13
  Filled 2013-12-13: qty 10

## 2013-12-13 MED ORDER — HYDROMORPHONE HCL PF 1 MG/ML IJ SOLN
INTRAMUSCULAR | Status: AC
  Administered 2013-12-13: 0.5 mg via INTRAVENOUS
  Filled 2013-12-13: qty 1

## 2013-12-13 MED ORDER — LACTATED RINGERS IV SOLN: INTRAVENOUS | Status: DC | PRN

## 2013-12-13 MED ORDER — SENNOSIDES-DOCUSATE SODIUM 8.6-50 MG PO TABS
1.0000 | ORAL_TABLET | Freq: Every evening | ORAL | Status: DC | PRN
  Filled 2013-12-13: qty 1

## 2013-12-13 MED ORDER — HYDROMORPHONE HCL PF 1 MG/ML IJ SOLN
0.2500 mg | INTRAMUSCULAR | Status: DC | PRN
  Administered 2013-12-13 (×2): 0.5 mg via INTRAVENOUS

## 2013-12-13 MED ORDER — MIDAZOLAM HCL 5 MG/5ML IJ SOLN
INTRAMUSCULAR | Status: DC | PRN
  Administered 2013-12-13: 2 mg via INTRAVENOUS

## 2013-12-13 MED ORDER — ONDANSETRON HCL 4 MG/2ML IJ SOLN
4.0000 mg | Freq: Four times a day (QID) | INTRAMUSCULAR | Status: DC | PRN
  Administered 2013-12-15 – 2013-12-17 (×3): 4 mg via INTRAVENOUS
  Filled 2013-12-13 (×3): qty 2

## 2013-12-13 MED ORDER — PROMETHAZINE HCL 25 MG/ML IJ SOLN: 6.2500 mg | INTRAMUSCULAR | Status: DC | PRN

## 2013-12-13 MED ORDER — GLYCOPYRROLATE 0.2 MG/ML IJ SOLN
INTRAMUSCULAR | Status: AC
  Filled 2013-12-13: qty 4

## 2013-12-13 MED ORDER — ALBUTEROL SULFATE (2.5 MG/3ML) 0.083% IN NEBU
2.5000 mg | INHALATION_SOLUTION | Freq: Four times a day (QID) | RESPIRATORY_TRACT | Status: DC
  Administered 2013-12-13 (×2): 2.5 mg via RESPIRATORY_TRACT
  Filled 2013-12-13 (×2): qty 3

## 2013-12-13 MED ORDER — FENTANYL CITRATE 0.05 MG/ML IJ SOLN
INTRAMUSCULAR | Status: DC | PRN
  Administered 2013-12-13 (×6): 50 ug via INTRAVENOUS
  Administered 2013-12-13: 100 ug via INTRAVENOUS
  Administered 2013-12-13: 50 ug via INTRAVENOUS
  Administered 2013-12-13: 100 ug via INTRAVENOUS
  Administered 2013-12-13 (×4): 50 ug via INTRAVENOUS

## 2013-12-13 MED ORDER — PROPOFOL 10 MG/ML IV BOLUS
INTRAVENOUS | Status: DC | PRN
  Administered 2013-12-13: 200 mg via INTRAVENOUS

## 2013-12-13 MED ORDER — ONDANSETRON HCL 4 MG/2ML IJ SOLN
INTRAMUSCULAR | Status: AC
  Filled 2013-12-13: qty 2

## 2013-12-13 MED ORDER — LACTATED RINGERS IV SOLN
INTRAVENOUS | Status: DC | PRN
  Administered 2013-12-13: 08:00:00 via INTRAVENOUS

## 2013-12-13 MED ORDER — MEPERIDINE HCL 25 MG/ML IJ SOLN: 6.2500 mg | INTRAMUSCULAR | Status: DC | PRN

## 2013-12-13 MED ORDER — DIPHENHYDRAMINE HCL 12.5 MG/5ML PO ELIX
12.5000 mg | ORAL_SOLUTION | Freq: Four times a day (QID) | ORAL | Status: DC | PRN
  Filled 2013-12-13: qty 5

## 2013-12-13 MED ORDER — EPINEPHRINE HCL 0.1 MG/ML IJ SOSY
PREFILLED_SYRINGE | INTRAMUSCULAR | Status: AC
  Filled 2013-12-13: qty 10

## 2013-12-13 MED ORDER — ENOXAPARIN SODIUM 40 MG/0.4ML ~~LOC~~ SOLN
40.0000 mg | SUBCUTANEOUS | Status: DC
  Administered 2013-12-14 – 2013-12-18 (×5): 40 mg via SUBCUTANEOUS
  Filled 2013-12-13 (×6): qty 0.4

## 2013-12-13 MED ORDER — LIDOCAINE HCL (CARDIAC) 20 MG/ML IV SOLN
INTRAVENOUS | Status: AC
  Filled 2013-12-13: qty 5

## 2013-12-13 MED ORDER — ONDANSETRON HCL 4 MG/2ML IJ SOLN
INTRAMUSCULAR | Status: DC | PRN
Start: 2013-12-13 — End: 2013-12-13
  Administered 2013-12-13: 4 mg via INTRAVENOUS

## 2013-12-13 MED ORDER — ARTIFICIAL TEARS OP OINT
TOPICAL_OINTMENT | OPHTHALMIC | Status: DC | PRN
  Administered 2013-12-13: 1 via OPHTHALMIC

## 2013-12-13 MED ORDER — ONDANSETRON HCL 4 MG/2ML IJ SOLN: 4.0000 mg | Freq: Four times a day (QID) | INTRAMUSCULAR | Status: DC | PRN

## 2013-12-13 MED ORDER — FENTANYL 10 MCG/ML IV SOLN
INTRAVENOUS | Status: DC
  Administered 2013-12-13: 13:00:00 via INTRAVENOUS
  Administered 2013-12-13: 15 ug via INTRAVENOUS
  Administered 2013-12-13: 222 ug via INTRAVENOUS
  Administered 2013-12-14: 195 ug via INTRAVENOUS
  Administered 2013-12-14: 255 ug via INTRAVENOUS
  Administered 2013-12-14: 249.6 ug via INTRAVENOUS
  Administered 2013-12-14: 60 ug via INTRAVENOUS
  Administered 2013-12-14: 150 ug via INTRAVENOUS
  Administered 2013-12-14: 14:00:00 via INTRAVENOUS
  Administered 2013-12-14: 210 ug via INTRAVENOUS
  Administered 2013-12-15: 240 ug via INTRAVENOUS
  Administered 2013-12-15: 193 ug via INTRAVENOUS
  Administered 2013-12-15: 165 ug via INTRAVENOUS
  Administered 2013-12-15: 09:00:00 via INTRAVENOUS
  Administered 2013-12-15: 255 ug via INTRAVENOUS
  Administered 2013-12-15: 300 ug via INTRAVENOUS
  Administered 2013-12-15: 174.4 ug via INTRAVENOUS
  Administered 2013-12-15: 195 ug via INTRAVENOUS
  Administered 2013-12-16: 255 ug via INTRAVENOUS
  Administered 2013-12-16: 08:00:00 via INTRAVENOUS
  Filled 2013-12-13 (×9): qty 50

## 2013-12-13 MED ORDER — ROCURONIUM BROMIDE 50 MG/5ML IV SOLN
INTRAVENOUS | Status: AC
  Filled 2013-12-13: qty 1

## 2013-12-13 SURGICAL SUPPLY — 81 items
ADH SKN CLS APL DERMABOND .7 (GAUZE/BANDAGES/DRESSINGS) ×3
APL SRG 22X2 LUM MLBL SLNT (VASCULAR PRODUCTS)
APL SRG 7X2 LUM MLBL SLNT (VASCULAR PRODUCTS)
APPLICATOR TIP COSEAL (VASCULAR PRODUCTS) IMPLANT
APPLICATOR TIP EXT COSEAL (VASCULAR PRODUCTS) IMPLANT
BLADE SURG 11 STRL SS (BLADE) IMPLANT
CANISTER SUCTION 2500CC (MISCELLANEOUS) ×4 IMPLANT
CATH KIT ON Q 5IN SLV (PAIN MANAGEMENT) IMPLANT
CATH THORACIC 28FR (CATHETERS) IMPLANT
CATH THORACIC 36FR (CATHETERS) IMPLANT
CATH THORACIC 36FR RT ANG (CATHETERS) IMPLANT
CLEANER TIP ELECTROSURG 2X2 (MISCELLANEOUS) ×1 IMPLANT
CLIP TI MEDIUM 6 (CLIP) ×1 IMPLANT
CONN ST 1/4X3/8  BEN (MISCELLANEOUS) ×1
CONN ST 1/4X3/8 BEN (MISCELLANEOUS) IMPLANT
CONN Y 3/8X3/8X3/8  BEN (MISCELLANEOUS) ×1
CONN Y 3/8X3/8X3/8 BEN (MISCELLANEOUS) IMPLANT
CONT SPEC 4OZ CLIKSEAL STRL BL (MISCELLANEOUS) ×8 IMPLANT
COVER TABLE BACK 60X90 (DRAPES) ×1 IMPLANT
DERMABOND ADVANCED (GAUZE/BANDAGES/DRESSINGS) ×1
DERMABOND ADVANCED .7 DNX12 (GAUZE/BANDAGES/DRESSINGS) IMPLANT
DRAIN CHANNEL 32F RND 10.7 FF (WOUND CARE) ×2 IMPLANT
DRAPE LAPAROSCOPIC ABDOMINAL (DRAPES) ×4 IMPLANT
DRAPE SLUSH/WARMER DISC (DRAPES) ×1 IMPLANT
DRAPE WARM FLUID 44X44 (DRAPE) ×3 IMPLANT
DRILL BIT 7/64X5 (BIT) ×1 IMPLANT
ELECT BLADE 4.0 EZ CLEAN MEGAD (MISCELLANEOUS) ×4
ELECT REM PT RETURN 9FT ADLT (ELECTROSURGICAL) ×4
ELECTRODE BLDE 4.0 EZ CLN MEGD (MISCELLANEOUS) ×3 IMPLANT
ELECTRODE REM PT RTRN 9FT ADLT (ELECTROSURGICAL) ×3 IMPLANT
GLOVE BIO SURGEON STRL SZ 6 (GLOVE) ×1 IMPLANT
GLOVE BIO SURGEON STRL SZ 6.5 (GLOVE) ×9 IMPLANT
GLOVE BIO SURGEON STRL SZ7 (GLOVE) ×1 IMPLANT
GLOVE BIOGEL PI IND STRL 6 (GLOVE) IMPLANT
GLOVE BIOGEL PI IND STRL 7.0 (GLOVE) IMPLANT
GLOVE BIOGEL PI INDICATOR 6 (GLOVE) ×3
GLOVE BIOGEL PI INDICATOR 7.0 (GLOVE) ×2
GOWN STRL REUS W/ TWL LRG LVL3 (GOWN DISPOSABLE) ×9 IMPLANT
GOWN STRL REUS W/TWL LRG LVL3 (GOWN DISPOSABLE) ×24
KIT BASIN OR (CUSTOM PROCEDURE TRAY) ×4 IMPLANT
KIT ROOM TURNOVER OR (KITS) ×4 IMPLANT
KIT SUCTION CATH 14FR (SUCTIONS) ×4 IMPLANT
MARKER SKIN DUAL TIP RULER LAB (MISCELLANEOUS) ×1 IMPLANT
NS IRRIG 1000ML POUR BTL (IV SOLUTION) ×8 IMPLANT
PACK CHEST (CUSTOM PROCEDURE TRAY) ×4 IMPLANT
PAD ARMBOARD 7.5X6 YLW CONV (MISCELLANEOUS) ×8 IMPLANT
PASSER SUT SWANSON 36MM LOOP (INSTRUMENTS) ×1 IMPLANT
SEALANT PROGEL (MISCELLANEOUS) IMPLANT
SEALANT SURG COSEAL 4ML (VASCULAR PRODUCTS) IMPLANT
SEALANT SURG COSEAL 8ML (VASCULAR PRODUCTS) IMPLANT
SOLUTION ANTI FOG 6CC (MISCELLANEOUS) ×4 IMPLANT
SPONGE GAUZE 4X4 12PLY (GAUZE/BANDAGES/DRESSINGS) ×4 IMPLANT
SPONGE GAUZE 4X4 12PLY STER LF (GAUZE/BANDAGES/DRESSINGS) ×1 IMPLANT
SUT BONE WAX W31G (SUTURE) IMPLANT
SUT PROLENE 3 0 SH DA (SUTURE) IMPLANT
SUT PROLENE 4 0 RB 1 (SUTURE)
SUT PROLENE 4-0 RB1 .5 CRCL 36 (SUTURE) IMPLANT
SUT SILK  1 MH (SUTURE) ×4
SUT SILK 1 MH (SUTURE) ×6 IMPLANT
SUT SILK 2 0SH CR/8 30 (SUTURE) IMPLANT
SUT SILK 3 0SH CR/8 30 (SUTURE) IMPLANT
SUT VIC AB 1 CTX 18 (SUTURE) ×1 IMPLANT
SUT VIC AB 1 CTX 36 (SUTURE)
SUT VIC AB 1 CTX36XBRD ANBCTR (SUTURE) IMPLANT
SUT VIC AB 2-0 CTX 36 (SUTURE) ×1 IMPLANT
SUT VIC AB 2-0 UR6 27 (SUTURE) ×1 IMPLANT
SUT VIC AB 3-0 X1 27 (SUTURE) ×1 IMPLANT
SUT VICRYL 2 TP 1 (SUTURE) ×1 IMPLANT
SWAB COLLECTION DEVICE MRSA (MISCELLANEOUS) IMPLANT
SYR 20ML ECCENTRIC (SYRINGE) ×1 IMPLANT
SYSTEM SAHARA CHEST DRAIN ATS (WOUND CARE) ×4 IMPLANT
TAPE CLOTH 4X10 WHT NS (GAUZE/BANDAGES/DRESSINGS) ×4 IMPLANT
TAPE CLOTH SURG 4X10 WHT LF (GAUZE/BANDAGES/DRESSINGS) ×1 IMPLANT
TIP APPLICATOR SPRAY EXTEND 16 (VASCULAR PRODUCTS) IMPLANT
TOWEL OR 17X24 6PK STRL BLUE (TOWEL DISPOSABLE) ×6 IMPLANT
TOWEL OR 17X26 10 PK STRL BLUE (TOWEL DISPOSABLE) ×9 IMPLANT
TRAP SPECIMEN MUCOUS 40CC (MISCELLANEOUS) ×3 IMPLANT
TRAY FOLEY CATH 14FRSI W/METER (CATHETERS) ×4 IMPLANT
TUBE ANAEROBIC SPECIMEN COL (MISCELLANEOUS) IMPLANT
TUBE CONNECTING 12X1/4 (SUCTIONS) ×2 IMPLANT
WATER STERILE IRR 1000ML POUR (IV SOLUTION) ×8 IMPLANT

## 2013-12-13 NOTE — Progress Notes (Signed)
TCTS BRIEF SICU PROGRESS NOTE  Day of Surgery  S/P Procedure(s) (LRB): VIDEO ASSISTED THORACOSCOPY (Right) DRAINAGE OF PLEURAL EFFUSION (Right) VIDEO BRONCHOSCOPY (Right) DECORTICATION   Awake and alert, reports fairly good analgesia NSR-sinus tach w/ stable BP Breathing comfortably on 2 L/min O2via [place] Chest tube output low volume serosanguinous, no air leak UOP adequate  Plan: Continue routine early postop  Derek Pennington H 12/13/2013 6:11 PM

## 2013-12-13 NOTE — Progress Notes (Signed)
Pt states right upper arm, shoulder especially posterior aspect numb and tingling . Decreased sensation but able to feel touch. Able to lift arm off bed, grip and push/pull with moderate strength. Rt leg normal sensation. Speech clear  Oriented x 4 . Dr. Servando Snare made aware.

## 2013-12-13 NOTE — Anesthesia Preprocedure Evaluation (Addendum)
Anesthesia Evaluation  Patient identified by MRN, date of birth, ID band Patient awake    Reviewed: Allergy & Precautions, H&P , NPO status , Patient's Chart, lab work & pertinent test results  Airway Mallampati: II TM Distance: >3 FB Neck ROM: Full    Dental no notable dental hx. (+) Teeth Intact, Dental Advisory Given   Pulmonary shortness of breath and at rest, pneumonia -, Recent URI , former smoker,  breath sounds clear to auscultation  Pulmonary exam normal       Cardiovascular negative cardio ROS  Rhythm:Regular Rate:Normal     Neuro/Psych negative neurological ROS  negative psych ROS   GI/Hepatic negative GI ROS, Neg liver ROS,   Endo/Other  negative endocrine ROS  Renal/GU negative Renal ROS  negative genitourinary   Musculoskeletal negative musculoskeletal ROS (+)   Abdominal   Peds negative pediatric ROS (+)  Hematology negative hematology ROS (+)   Anesthesia Other Findings   Reproductive/Obstetrics negative OB ROS                        Anesthesia Physical Anesthesia Plan  ASA: III  Anesthesia Plan: General   Post-op Pain Management:    Induction: Intravenous  Airway Management Planned: Oral ETT and Double Lumen EBT  Additional Equipment: Arterial line and CVP  Intra-op Plan:   Post-operative Plan: Extubation in OR  Informed Consent: I have reviewed the patients History and Physical, chart, labs and discussed the procedure including the risks, benefits and alternatives for the proposed anesthesia with the patient or authorized representative who has indicated his/her understanding and acceptance.   Dental advisory given  Plan Discussed with: CRNA, Anesthesiologist and Surgeon  Anesthesia Plan Comments:        Anesthesia Quick Evaluation

## 2013-12-13 NOTE — OR Nursing (Signed)
Video bronch ended at [phone].

## 2013-12-13 NOTE — Addendum Note (Signed)
Addendum created 12/13/13 1410 by Montez Hageman, MD   Modules edited: Notes Section   Notes Section:  File: [id number]

## 2013-12-13 NOTE — Preoperative (Signed)
Beta Blockers   Reason not to administer Beta Blockers:Not Applicable 

## 2013-12-13 NOTE — Progress Notes (Signed)
Name: Derek Pennington MRN: [id number] DOB: 03-Nov-1955    ADMISSION DATE:  12/11/2013 CONSULTATION DATE:  12/11/2013  REFERRING MD :  EDP PRIMARY SERVICE: PCCM  CHIEF COMPLAINT:  Shortness of breath  BRIEF PATIENT DESCRIPTION: 58 y.o. M adm to PCCM service with RLL CAP and parapneumonic eff vs empyema. S/P thoracentesis in ED  SIGNIFICANT EVENTS / STUDIES:  2/23 CT chest: RLL consolidation with partially loculated R effusion  2/23 Thoracentesis: LDH 1116, WBC 10,627 with 85% segs 2/25 VATS procedure  LINES / TUBES: none  CULTURES: U.Strep 2/23 >> neg U. Legionella 2/23 >> neg Pleural fluid 2/23 >> few GPC in chains, abundant WBC Blood 2/23 >>   ANTIBIOTICS: Vanc 2/23 X 1  Pip-tazo 2/23 X 1  Azithro 2/23 >>  Ceftriaxone 2/23 >>   SUBJECTIVE: continue pain controlled with medications. Some shortness of breath. Awaiting vats this morning. Afebrile.  VITAL SIGNS: Temp:  [98.1 F (36.7 C)-99.7 F (37.6 C)] 99.2 F (37.3 C) (02/25 0436) Pulse Rate:  [88-129] 105 (02/25 0400) Resp:  [14-29] 28 (02/25 0400) BP: (106-144)/(71-103) 144/103 mmHg (02/25 0400) SpO2:  [92 %-100 %] 99 % (02/25 0400) Weight:  [109.4 kg (241 lb 2.9 oz)] 109.4 kg (241 lb 2.9 oz) (02/25 0359) HEMODYNAMICS:   VENTILATOR SETTINGS:   INTAKE / OUTPUT: Intake/Output     02/24 0701 - 02/25 0700   P.O. 300   I.V. (mL/kg) 1100 (10.1)   IV Piggyback 300   Total Intake(mL/kg) 1700 (15.5)   Urine (mL/kg/hr) 1575 (0.6)   Total Output 1575   Net +125         PHYSICAL EXAMINATION: General: Pleasant, cognition intact  Neuro: Alert, answers questions appropriately  HEENT: [place]/AT, [place] in place  Cardiovascular: Tachy, reg, no M  Lungs: Diminished RLL, no crackles, no wheezes, no increased work or breathing Abdomen: BS, soft, NT/ND.  Ext: No edema.  Skin: Warm and dry   LABS:  CBC  Recent Labs Lab 12/11/13 1452 12/11/13 1532 12/12/13 0220 12/12/13 2046  WBC 38.3*  --  35.4* 30.7*  HGB  16.3 17.7* 14.3 15.0  HCT 46.3 52.0 41.8 43.3  PLT 262  --  218 222   Coag's  Recent Labs Lab 12/12/13 2046  APTT 34  INR 1.06   BMET  Recent Labs Lab 12/11/13 1532 12/12/13 0220 12/12/13 2046  NA 136* 133* 134*  K 4.5 4.3 4.2  CL 98 93* 94*  CO2  --  27 29  BUN 20 18 15   CREATININE 1.30 1.44* 1.32  GLUCOSE 135* 145* 130*   Electrolytes  Recent Labs Lab 12/12/13 0220 12/12/13 2046  CALCIUM 8.5 8.8   Sepsis Markers  Recent Labs Lab 12/11/13 1642 12/12/13 0220 12/12/13 2046  PROCALCITON 3.00 4.19 3.57   ABG  Recent Labs Lab 12/12/13 2235  PHART 7.412  PCO2ART 41.8  PO2ART 53.4*   Liver Enzymes  Recent Labs Lab 12/12/13 2046  AST 14  ALT 19  ALKPHOS 103  BILITOT 0.8  ALBUMIN 2.5*   Cardiac Enzymes No results found for this basename: TROPONINI, PROBNP,  in the last 168 hours Glucose  Recent Labs Lab 12/11/13 1809 12/12/13 12/12/13 0424 12/12/13 0830  GLUCAP 110* 146* 163* 143*    Imaging Dg Chest 2 View  12/11/2013   CLINICAL DATA:  Fall.  EXAM: CHEST  2 VIEW  COMPARISON:  None.  FINDINGS: Right lower lobe infiltrate with right pleural effusion noted. Pleural fluid noted in the minor fissure. Basilar atelectasis.  Possible infiltrate left lung base noted. Cardiomegaly. No pulmonary venous congestion. No acute osseous abnormality.  IMPRESSION: 1. Right lower lobe infiltrate consistent with pneumonia. Associated right pleural effusion is present with fluid in the minor fissure. 2. Left base atelectatic changes, mild infiltrate in the left lung base cannot be excluded. 3. Cardiomegaly, no pulmonary venous distention.   Electronically Signed   By: Marcello Moores  Register   On: 12/11/2013 12:39   Ct Angio Chest W/cm &/or Wo Cm  12/11/2013   CLINICAL DATA:  Cough for 2 weeks.  Right side chest pain.  EXAM: CT ANGIOGRAPHY CHEST WITH CONTRAST  TECHNIQUE: Multidetector CT imaging of the chest was performed using the standard protocol during bolus  administration of intravenous contrast. Multiplanar CT image reconstructions and MIPs were obtained to evaluate the vascular anatomy.  CONTRAST:  100 mL OMNIPAQUE IOHEXOL 350 MG/ML SOLN  COMPARISON:  PA and lateral chest 12/11/2013.  FINDINGS: No pulmonary embolus is identified. The patient has a moderate right pleural effusion. The superior component of the effusion has a convex border suggesting loculation. There is no left pleural effusion or pericardial effusion. Heart size is normal. No axillary, hilar or mediastinal lymphadenopathy is identified. Extensive right lower lobe airspace disease is present consistent with pneumonia and atelectasis. Mild left basilar atelectasis is noted. Visualized upper abdomen is unremarkable. No focal bony abnormality is identified.  Review of the MIP images confirms the above findings.  IMPRESSION: Negative for pulmonary embolus.  Moderate right pleural effusion the superior portion of which appears loculated. Extensive right lower lobe airspace is consistent with pneumonia and atelectasis.   Electronically Signed   By: Inge Rise M.D.   On: 12/11/2013 15:52   Dg Chest Port 1 View  12/12/2013   CLINICAL DATA:  Shortness of breath, pleural effusion  EXAM: PORTABLE CHEST - 1 VIEW  COMPARISON:  Portable exam 0502 hr compared to 12/11/2013  FINDINGS: Enlargement of cardiac silhouette.  Mediastinal contours and pulmonary vascularity normal.  Persistent right pleural effusion with atelectasis versus consolidation at right lung base.  Tiny left pleural effusion.  No pneumothorax.  Bones unremarkable.  IMPRESSION: Enlargement of cardiac silhouette with persistent right pleural effusion and basilar atelectasis versus consolidation.   Electronically Signed   By: Lavonia Dana M.D.   On: 12/12/2013 08:01   Dg Chest Portable 1 View  12/11/2013   CLINICAL DATA:  Cough, shortness of Breath  EXAM: PORTABLE CHEST - 1 VIEW  COMPARISON:  12/11/2013  FINDINGS: Cardiomegaly is noted.  There is moderate right pleural effusion with right lower lobe atelectasis or infiltrate. No pulmonary edema.  IMPRESSION: Moderate right pleural effusion with right lower lobe atelectasis or infiltrate. No pulmonary edema. Follow-up examination to assure resolution is recommended.   Electronically Signed   By: Lahoma Crocker M.D.   On: 12/11/2013 17:10    CXR: continued right pleural effusion  ASSESSMENT / PLAN:  PULMONARY  A:  Acute resp failure  RLL CAP  Parapneumonic effusion vs empyema (pos G stain & purulent appearance argues for empyema) s/p drainage of 250 cc P:   Supplemental O2 to maintain SpO2 > 90%  Abx as above  Appreciate TCTS help - vats today  CARDIOVASCULAR  A:  Hypertension  HLD P:  Monitor  Post procedure will plan on restarting home BP meds slowly  Continue home simvastatin  RENAL  A:  Mild AKI (baseline Cr 1.1) - improving P:  Monitor BMET   Monitor I/Os  Correct electrolytes as indicated  GASTROINTESTINAL  A:  No acute issues.  P:  NPO for procedure  HEMATOLOGIC  A:  Hemoconcentration  P:  DVT Px: Enoxaparin/SCD's.  Monitor CBC  INFECTIOUS  A:  CAP  Parapneumonic eff vs empyema  Severe sepsis with marked leukocytosis  P:  - PCT algorithm. 4.19 today - Continue antibiotics as ordered.  - Trend WBC/fever curve.  - f/u cultures - vats today  ENDOCRINE  A:  No acute issues.  P:  - Monitor glucose on BMP.   NEUROLOGIC  A:  Pain P:  PRN oxycodone/acetaminophen  PRN ketorolac (with caution given mild AKI) PRN fentanyl PCA post op    Tommi Rumps  TODAY'S SUMMARY: Examined pt again after VATS, pain controlled with PCA, pus drained, culture has not grown out - suspect pneumococcus here, continue Abx Post op care per TCTS, appreciate their prompt assistance.  I have personally obtained a history, examined the patient, evaluated laboratory and imaging results, formulated the assessment and plan and placed  orders.  Pulmonary and [place] Pager: [phone]  12/13/2013, 6:54 AM

## 2013-12-13 NOTE — Anesthesia Postprocedure Evaluation (Addendum)
  Anesthesia Post-op Note  Patient: Derek Pennington  Procedure(s) Performed: Procedure(s) (LRB): VIDEO ASSISTED THORACOSCOPY (Right) DRAINAGE OF PLEURAL EFFUSION (Right) VIDEO BRONCHOSCOPY (Right) DECORTICATION  Patient Location: PACU  Anesthesia Type: General  Level of Consciousness: awake and alert   Airway and Oxygen Therapy: Patient Spontanous Breathing  Post-op Pain: mild  Post-op Assessment: Post-op Vital signs reviewed, Patient's Cardiovascular Status Stable, Respiratory Function Stable, Patent Airway and No signs of Nausea or vomiting  Last Vitals:  Filed Vitals:   12/13/13 1245  BP: 144/86  Pulse: 110  Temp:   Resp: 24    Post-op Vital Signs: stable   Complications: Pt c/o numbess over scapula on right (operative side). Sensation is intact to light touch, but he finds this uncomforable. Some radiation into arm. Motor and sensation intact at hand, biceps and triceps. He is able to shrug shoulder normally.  ? Long thoracic nerve injury from stretch or surgical manipulation or positioning. I have reassured the patient that this type of neve stretch injury usually resolves spontaneously.

## 2013-12-13 NOTE — Brief Op Note (Addendum)
      [address]       [place] [address]             [phone]      12/13/2013  11:25 AM  PATIENT:  Derek Pennington  58 y.o. male  PRE-OPERATIVE DIAGNOSIS:  RIGHT PLEURAL EFFUSION/ Empyema POST-OPERATIVE DIAGNOSIS:  RIGHT EMPYEMA  PROCEDURE:   RIGHT VIDEO ASSISTED THORACOSCOPY/MINI-THORACOTOMY  DRAINAGE OF PLEURAL EFFUSION/ Empyema   VIDEO BRONCHOSCOPY  DECORTICATION  SURGEON:  Grace Isaac, MD  ASSISTANT: Suzzanne Cloud, PA-C  ANESTHESIA:   general  SPECIMEN:  Source of Specimen:  Right empyema and pleural fluid  DISPOSITION OF SPECIMEN:  Pathology  DRAINS: 42 Blake drain x2  PATIENT CONDITION:  PACU - hemodynamically stable.          Grace Isaac MD      [place] [place] [address] Office [phone]   Beeper [phone]

## 2013-12-13 NOTE — Anesthesia Procedure Notes (Addendum)
Procedure Name: Intubation Date/Time: 12/13/2013 8:47 AM Performed by: Erik Obey Pre-anesthesia Checklist: Patient identified, Emergency Drugs available, Suction available, Timeout performed and Patient being monitored Patient Re-evaluated:Patient Re-evaluated prior to inductionOxygen Delivery Method: Circle system utilized Preoxygenation: Pre-oxygenation with 100% oxygen Intubation Type: IV induction Ventilation: Oral airway inserted - appropriate to patient size and Mask ventilation without difficulty Laryngoscope Size: Miller and 2 Grade View: Grade III Tube type: Oral Tube size: 8.0 mm Number of attempts: 1 Airway Equipment and Method: Stylet Placement Confirmation: positive ETCO2,  breath sounds checked- equal and bilateral and ETT inserted through vocal cords under direct vision Secured at: 23 cm Tube secured with: Tape Dental Injury: Teeth and Oropharynx as per pre-operative assessment     Procedure Name: Intubation Date/Time: 12/13/2013 9:11 AM Performed by: Erik Obey Pre-anesthesia Checklist: Emergency Drugs available, Suction available, Patient being monitored, Timeout performed and Patient identified Oxygen Delivery Method: Circle system utilized Preoxygenation: Pre-oxygenation with 100% oxygen Intubation Type: IV induction Laryngoscope Size: Mac and 4 Grade View: Grade IV Tube type: Oral Endobronchial tube: Left, Double lumen EBT, EBT position confirmed by auscultation and EBT position confirmed by fiberoptic bronchoscope and 39 Fr Number of attempts: 1 Airway Equipment and Method: Stylet Placement Confirmation: ETT inserted through vocal cords under direct vision,  positive ETCO2 and breath sounds checked- equal and bilateral Secured at: 30 cm Tube secured with: Tape Dental Injury: Teeth and Oropharynx as per pre-operative assessment

## 2013-12-13 NOTE — Progress Notes (Signed)
CRITICAL VALUE ALERT  Critical value received: Right Pleural Fluid- Abundant white cells, predominately PMN, Few gram positive cocci and chains   Date of notification:  12/12/2013  Time of notification:  0758  Critical value read back: yes  Nurse who received alert:  D. Augustin Coupe, RN  MD notified (1st page):  Dr. Caryl Bis  Time of first page:  [phone]  MD notified (2nd page):  Time of second page:  Responding MD:  Dr. Caryl Bis  Time MD responded:  Patient already on antibiotics

## 2013-12-13 NOTE — Transfer of Care (Signed)
Immediate Anesthesia Transfer of Care Note  Patient: Derek Pennington  Procedure(s) Performed: Procedure(s): VIDEO ASSISTED THORACOSCOPY (Right) DRAINAGE OF PLEURAL EFFUSION (Right) VIDEO BRONCHOSCOPY (Right) DECORTICATION  Patient Location: PACU  Anesthesia Type:General  Level of Consciousness: awake, alert  and oriented  Airway & Oxygen Therapy: Patient Spontanous Breathing and Patient connected to face mask oxygen  Post-op Assessment: Report given to PACU RN, Post -op Vital signs reviewed and stable and Patient moving all extremities X 4  Post vital signs: Reviewed and stable  Complications: No apparent anesthesia complications

## 2013-12-14 ENCOUNTER — Inpatient Hospital Stay (HOSPITAL_COMMUNITY): Payer: BC Managed Care – PPO

## 2013-12-14 LAB — POCT I-STAT 3, ART BLOOD GAS (G3+)
Acid-Base Excess: 4 mmol/L — ABNORMAL HIGH (ref 0.0–2.0)
Bicarbonate: 30.6 mEq/L — ABNORMAL HIGH (ref 20.0–24.0)
O2 Saturation: 93 %
PCO2 ART: 50.8 mmHg — AB (ref 35.0–45.0)
PO2 ART: 68 mmHg — AB (ref 80.0–100.0)
Patient temperature: 36.9
TCO2: 32 mmol/L (ref 0–100)
pH, Arterial: 7.387 (ref 7.350–7.450)

## 2013-12-14 LAB — CBC
HCT: 36.4 % — ABNORMAL LOW (ref 39.0–52.0)
HEMOGLOBIN: 12.2 g/dL — AB (ref 13.0–17.0)
MCH: 28.6 pg (ref 26.0–34.0)
MCHC: 33.5 g/dL (ref 30.0–36.0)
MCV: 85.4 fL (ref 78.0–100.0)
Platelets: 221 10*3/uL (ref 150–400)
RBC: 4.26 MIL/uL (ref 4.22–5.81)
RDW: 13.7 % (ref 11.5–15.5)
WBC: 20.3 10*3/uL — ABNORMAL HIGH (ref 4.0–10.5)

## 2013-12-14 LAB — BASIC METABOLIC PANEL
BUN: 10 mg/dL (ref 6–23)
CO2: 28 mEq/L (ref 19–32)
Calcium: 8 mg/dL — ABNORMAL LOW (ref 8.4–10.5)
Chloride: 96 mEq/L (ref 96–112)
Creatinine, Ser: 1.06 mg/dL (ref 0.50–1.35)
GFR calc Af Amer: 88 mL/min — ABNORMAL LOW (ref >90)
GFR, EST NON AFRICAN AMERICAN: 76 mL/min — AB (ref >90)
GLUCOSE: 143 mg/dL — AB (ref 70–99)
POTASSIUM: 4 meq/L (ref 3.7–5.3)
SODIUM: 134 meq/L — AB (ref 137–147)

## 2013-12-14 MED ORDER — ALBUTEROL SULFATE (2.5 MG/3ML) 0.083% IN NEBU
2.5000 mg | INHALATION_SOLUTION | RESPIRATORY_TRACT | Status: DC | PRN
Start: 2013-12-14 — End: 2013-12-18
  Administered 2013-12-16: 2.5 mg via RESPIRATORY_TRACT
  Filled 2013-12-14: qty 3

## 2013-12-14 MED ORDER — SODIUM CHLORIDE 0.9 % IV SOLN
INTRAVENOUS | Status: DC
  Administered 2013-12-15 (×2): 20 mL/h via INTRAVENOUS

## 2013-12-14 MED ORDER — ALBUTEROL SULFATE (2.5 MG/3ML) 0.083% IN NEBU
2.5000 mg | INHALATION_SOLUTION | Freq: Three times a day (TID) | RESPIRATORY_TRACT | Status: DC
  Administered 2013-12-14: 2.5 mg via RESPIRATORY_TRACT
  Filled 2013-12-14: qty 3

## 2013-12-14 NOTE — Progress Notes (Signed)
Patient ID: Derek Pennington, male   DOB: 14-Oct-1956, 58 y.o.   MRN: [id number] EVENING ROUNDS NOTE :     [place]       [place] [address]             [phone]                 1 Day Post-Op Procedure(s) (LRB): VIDEO ASSISTED THORACOSCOPY (Right) DRAINAGE OF PLEURAL EFFUSION (Right) VIDEO BRONCHOSCOPY (Right) DECORTICATION  Total Length of Stay:  LOS: 3 days  BP 134/83  Pulse 110  Temp(Src) 98.6 F (37 C) (Oral)  Resp 20  Ht 5' 10.87" (1.8 m)  Wt 241 lb 2.9 oz (109.4 kg)  BMI 33.77 kg/m2  SpO2 94%  .Intake/Output     02/25 0701 - 02/26 0700 02/26 0701 - 02/27 0700   P.O.  600   I.V. (mL/kg) 2398 (21.9) 200.5 (1.8)   IV Piggyback 300 300   Total Intake(mL/kg) 2698 (24.7) 1100.5 (10.1)   Urine (mL/kg/hr) 3235 (1.2) 515 (0.4)   Chest Tube 620 (0.2) 100 (0.1)   Total Output 3855 615   Net -1157 +485.5          . sodium chloride 20 mL/hr (12/14/13 1015)     Lab Results  Component Value Date   WBC 20.3* 12/14/2013   HGB 12.2* 12/14/2013   HCT 36.4* 12/14/2013   PLT 221 12/14/2013   GLUCOSE 143* 12/14/2013   CHOL 158 08/12/2012   TRIG 134.0 08/12/2012   HDL 40.50 08/12/2012   LDLDIRECT 198.4 10/16/2009   LDLCALC 91 08/12/2012   ALT 19 12/12/2013   AST 14 12/12/2013   NA 134* 12/14/2013   K 4.0 12/14/2013   CL 96 12/14/2013   CREATININE 1.06 12/14/2013   BUN 10 12/14/2013   CO2 28 12/14/2013   TSH 1.66 08/12/2012   PSA 2.33 08/12/2012   INR 1.06 12/12/2013   Still with right chest wall pain, drainage decreased no air leak Culture so far: FEW MICROAEROPHILIC STREPTOCOCCI  Grace Isaac MD  Beeper [phone] Office [phone] 12/14/2013 5:46 PM

## 2013-12-14 NOTE — Progress Notes (Signed)
PULMONARY / CRITICAL CARE MEDICINE  Name: Derek Pennington MRN: [id number] DOB: Feb 03, 1956    ADMISSION DATE:  12/11/2013 CONSULTATION DATE:  12/11/2013  REFERRING MD :  EDP PRIMARY SERVICE: PCCM  CHIEF COMPLAINT:  Shortness of breath  BRIEF PATIENT DESCRIPTION: 58 yo with RLL CAP and empyema.  SIGNIFICANT EVENTS / STUDIES:  2/23  CT chest >>> RLL consolidation with partially loculated R effusion  2/23  Thoracentesis >>> LDH 1116, WBC 10,627 with 85% segs 2/25  VATS procedure  LINES / TUBES: R chest tube Servando Snare ) 2/25 >>>  CULTURES: 2/23  U.Strep  >>> neg 2/23  U. Legionella  >>> neg 2/23  Pleural fluid  >>> few GPC in chains, abundant WBC 2/23  Blood  >>>  2/25  Respiratory >>> 2/25  Pleural fluid >>>  ANTIBIOTICS: Vanc 2/23 x 1  Pip-tazo 2/23 x 1  Azithro 2/23 >>>  Ceftriaxone 2/23 >>>   SUBJECTIVE: Fair pain control.  No significant dyspnea.  VITAL SIGNS: Temp:  [97 F (36.1 C)-99.8 F (37.7 C)] 99.6 F (37.6 C) (02/26 0715) Pulse Rate:  [86-117] 90 (02/26 0700) Resp:  [14-28] 16 (02/26 0700) BP: (96-151)/(63-95) 115/77 mmHg (02/26 0700) SpO2:  [90 %-99 %] 94 % (02/26 0744) Arterial Line BP: (103-177)/(67-89) 103/89 mmHg (02/25 1314)  HEMODYNAMICS:   VENTILATOR SETTINGS:   INTAKE / OUTPUT: Intake/Output     02/25 0701 - 02/26 0700 02/26 0701 - 02/27 0700   P.O.     I.V. (mL/kg) 2398 (21.9)    IV Piggyback 300    Total Intake(mL/kg) 2698 (24.7)    Urine (mL/kg/hr) 3235 (1.2)    Chest Tube 620 (0.2)    Total Output 3855     Net -1157            PHYSICAL EXAMINATION: General: No distress Neuro: Alert, awake HEENT: PERRL Cardiovascular: Tachycardic, regular Lungs: Bilateral air entry, chest tube >>> no air leak, draining serosanguinous fluid Abdomen: Soft, non tender Ext: No edema.  Skin: Warm and dry  LABS:  CBC  Recent Labs Lab 12/12/13 0220 12/12/13 2046 12/14/13 0410  WBC 35.4* 30.7* 20.3*  HGB 14.3 15.0 12.2*  HCT 41.8 43.3  36.4*  PLT 218 222 221   Coag's  Recent Labs Lab 12/12/13 2046  APTT 34  INR 1.06   BMET  Recent Labs Lab 12/12/13 0220 12/12/13 2046 12/14/13 0410  NA 133* 134* 134*  K 4.3 4.2 4.0  CL 93* 94* 96  CO2 27 29 28   BUN 18 15 10   CREATININE 1.44* 1.32 1.06  GLUCOSE 145* 130* 143*   Electrolytes  Recent Labs Lab 12/12/13 0220 12/12/13 2046 12/14/13 0410  CALCIUM 8.5 8.8 8.0*   Sepsis Markers  Recent Labs Lab 12/11/13 1642 12/12/13 0220 12/12/13 2046  PROCALCITON 3.00 4.19 3.57   ABG  Recent Labs Lab 12/12/13 2235 12/14/13 0424  PHART 7.412 7.387  PCO2ART 41.8 50.8*  PO2ART 53.4* 68.0*   Liver Enzymes  Recent Labs Lab 12/12/13 2046  AST 14  ALT 19  ALKPHOS 103  BILITOT 0.8  ALBUMIN 2.5*   Cardiac Enzymes No results found for this basename: TROPONINI, PROBNP,  in the last 168 hours Glucose  Recent Labs Lab 12/11/13 1809 12/12/13 12/12/13 0424 12/12/13 0830  GLUCAP 110* 146* 163* 143*   Imaging Dg Chest Portable 1 View  12/13/2013   CLINICAL DATA:  Postop.  EXAM: PORTABLE CHEST - 1 VIEW  COMPARISON:  DG CHEST 1V PORT dated 12/12/2013; CT ANGIO  CHEST W/CM &/OR WO/CM dated 12/11/2013; DG CHEST 1V PORT dated 12/11/2013; DG CHEST 2 VIEW dated 12/11/2013  FINDINGS: Patient is rotated. Right subclavian central line tip projects over the SVC. Two right chest tubes are in place with tips at the apex and base of the right hemi thorax. No definite pneumothorax. Extensive right lung airspace disease. Loculated right pleural effusion, decreased in size. Small left pleural effusion.  IMPRESSION: 1. Interval placement of a right subclavian central line and 2 right chest tubes without pneumothorax. 2. Decrease in size of a loculated right pleural effusion. 3. Extensive right lung airspace disease.   Electronically Signed   By: Lorin Picket M.D.   On: 12/13/2013 13:54    CXR: continued right pleural effusion  ASSESSMENT / PLAN:  PULMONARY  A:  Acute resp  failure, resolved RLL CAP  Empyema, s/p VATS P:   Supplemental O2 to maintain SpO2 > 90%  Abx as above  Chest tube management per TCTS Albuterol change to PRN  CARDIOVASCULAR  A:  HLD H/o Hypertension, now normotensive P:  Preadmission Simvastatin No antihypertensives noted on preadmission medication list  RENAL  A:  AKI, resolved P:  Trend BMP  GASTROINTESTINAL  A:  Nutrition GI Px is not indicated P:  Diet Bowel regimen while on opioids  HEMATOLOGIC  A:  Anemia of acute blood loss VTE Px P:  Trend CBC Enoxaparin/SCD's.   INFECTIOUS  A:  CAP  Empyema P:  Abx / cultures as above  ENDOCRINE  A:  No acute issues P:  Monitor glucose on BMP.   NEUROLOGIC  A:  Post op pain P:  Ultram Oxycodone PCA  I have personally obtained history, examined patient, evaluated and interpreted laboratory and imaging results, reviewed medical records, formulated assessment / plan and placed orders.  Doree Fudge, MD Pulmonary and [place] Pager: [phone]  12/14/2013, 7:55 AM

## 2013-12-14 NOTE — Op Note (Signed)
NAMEELJAY, LAVE NO.:  [id number]  MEDICAL RECORD NO.:  [id number]  LOCATION:  2S02C                        FACILITY:  [facility]  PHYSICIAN:  Lanelle Bal, MD    DATE OF BIRTH:  [birth year]  DATE OF PROCEDURE:  12/13/2013 DATE OF DISCHARGE:                              OPERATIVE REPORT   PREOPERATIVE DIAGNOSIS:  Right empyema.  POSTOPERATIVE DIAGNOSIS:  Right empyema.  SURGICAL PROCEDURE:  Bronchoscopy with right video-assisted thoracoscopy, mini thoracotomy, drainage of empyema, and decortication.  SURGEON:  Lanelle Bal, MD  FIRST ASSISTANT:  Suzzanne Cloud, P.A.  BRIEF HISTORY:  The patient is a 58 year old male with a 2-week history of increasing right pleuritic chest pain, initially treated as reactive airway disease, but the symptoms progressed with increasing shortness of breath and right chest pain, to the point, the patient was admitted by EMS 2 days prior to surgery.  On admission, he was started on IV antibiotics.  Pulmonary Service saw the patient and did a thoracentesis. The following day, a surgical consultation was requested for consideration of drainage of empyema, risks and options.  The patient's CT and chest x-ray were consistent with multiloculated right empyema. The thoracentesis that was performed, drained approximately 250 mL, but there was little change on x-ray.  Bronchoscopy and right video-assisted thoracoscopy and drainage of empyema with decortication was recommended to the patient.  The risks and options were discussed with him in detail, and he was agreeable with proceeding.  DESCRIPTION OF PROCEDURE:  With central line and arterial line in place, the patient underwent general endotracheal anesthesia without incident. Through the appropriate time-out was performed and through the single- lumen endotracheal tube, a fiberoptic bronchoscope was passed to the subsegmental level both in the right and the left mainstem  bronchus. There were no endobronchial lesions appreciated.  There were very scant secretions.  The primary pulmonary process was in the right lower lobe bronchial lavage and suction of the right lower lobe particularly was performed and specimen sent for cultures and cytology.  The scope was removed.  The patient was turned in the lateral decubitus position with the right side up.  Skin of the right chest was prepped with Betadine and draped in usual sterile manner.  The patient's preoperative skin markings were visible.  Initially, a single port site was made along the midaxillary line approximately sixth intercostal space.  It was indicated on x-ray that the patient had extensive empyema.  Through this initial site, loculations were broken up.  Ultimately, we placed port sites anterior and posterior and the middle port site was enlarged to a mini thoracotomy.  Through these, we were able to drain the empyema and performed decortication.  With this completed, two chest tubes were left through the port sites.  The incision was then closed with interrupted 0 Vicryl in the muscle layer and running 2-0 Vicryl in the subcutaneous tissue, and 3-0 subcuticular stitch.  The lung was reinflated well, approximately 1000 mL of fluid were removed.  Actual blood loss was estimated at 150-200 mL.  Dry dressings were applied.  Sponge and needle count were reported as correct at the completion of the procedure.  The patient tolerated the  procedure without obvious complication and was extubated in the operating room and transferred to the recovery room.     Lanelle Bal, MD     EG/MEDQ  D:  12/14/2013  T:  12/14/2013  Job:  [id number]

## 2013-12-14 NOTE — Progress Notes (Signed)
Patient ID: Derek Pennington, male   DOB: Feb 03, 1956, 58 y.o.   MRN: [id number] TCTS DAILY ICU PROGRESS NOTE                   [place]            [place] [address]          [phone]   1 Day Post-Op Procedure(s) (LRB): VIDEO ASSISTED THORACOSCOPY (Right) DRAINAGE OF PLEURAL EFFUSION (Right) VIDEO BRONCHOSCOPY (Right) DECORTICATION  Total Length of Stay:  LOS: 3 days   Subjective: Feels better this am, breathing better  Objective: Vital signs in last 24 hours: Temp:  [97 F (36.1 C)-99.8 F (37.7 C)] 99.6 F (37.6 C) (02/26 0715) Pulse Rate:  [86-117] 98 (02/26 0800) Cardiac Rhythm:  [-] Normal sinus rhythm (02/26 0800) Resp:  [14-28] 19 (02/26 0800) BP: (96-151)/(63-95) 117/81 mmHg (02/26 0800) SpO2:  [90 %-99 %] 95 % (02/26 0800) Arterial Line BP: (103-177)/(67-89) 103/89 mmHg (02/25 1314)  Filed Weights   12/11/13 1556 12/12/13 0500 12/13/13 0359  Weight: 242 lb 1 oz (109.8 kg) 240 lb 15.4 oz (109.3 kg) 241 lb 2.9 oz (109.4 kg)    Weight change:    Hemodynamic parameters for last 24 hours:    Intake/Output from previous day: 02/25 0701 - 02/26 0700 In: 2698 [I.V.:2398; IV Piggyback:300] Out: 6283 [Urine:3235; Chest Tube:620]  Intake/Output this shift: Total I/O In: 25 [I.V.:25] Out: 225 [Urine:225]  Current Meds: Scheduled Meds: . acetaminophen  1,000 mg Oral 4 times per day   Or  . acetaminophen (TYLENOL) oral liquid 160 mg/5 mL  1,000 mg Oral 4 times per day  . azithromycin  500 mg Intravenous Q24H  . bisacodyl  10 mg Oral Daily  . cefTRIAXone (ROCEPHIN)  IV  1 g Intravenous Q24H  . enoxaparin (LOVENOX) injection  40 mg Subcutaneous Q24H  . fentaNYL   Intravenous 6 times per day  . simvastatin  40 mg Oral q1800   Continuous Infusions:  PRN Meds:.albuterol, diphenhydrAMINE, diphenhydrAMINE, naloxone, ondansetron (ZOFRAN) IV, oxyCODONE, oxyCODONE-acetaminophen, potassium chloride, senna-docusate, sodium chloride,  traMADol  General appearance: alert and cooperative Neurologic: intact Heart: regular rate and rhythm, S1, S2 normal, no murmur, click, rub or gallop Lungs: diminished breath sounds RLL and RML Abdomen: soft, non-tender; bowel sounds normal; no masses,  no organomegaly Extremities: extremities normal, atraumatic, no cyanosis or edema and Homans sign is negative, no sign of DVT Wound: dressing intact  Lab Results: CBC: Recent Labs  12/12/13 2046 12/14/13 0410  WBC 30.7* 20.3*  HGB 15.0 12.2*  HCT 43.3 36.4*  PLT 222 221   BMET:  Recent Labs  12/12/13 2046 12/14/13 0410  NA 134* 134*  K 4.2 4.0  CL 94* 96  CO2 29 28  GLUCOSE 130* 143*  BUN 15 10  CREATININE 1.32 1.06  CALCIUM 8.8 8.0*    PT/INR:  Recent Labs  12/12/13 2046  LABPROT 13.6  INR 1.06   Radiology: Dg Chest Port 1 View  12/14/2013   CLINICAL DATA:  Pleural effusion  EXAM: PORTABLE CHEST - 1 VIEW  COMPARISON:  DG CHEST 1V PORT dated 12/13/2013  FINDINGS: Right chest tubes and right subclavian central venous catheter stable. Airspace disease throughout the right lung and left base are improved. Pleural changes in the right lung are not significantly changed. No pneumothorax. Cardiomegaly.  IMPRESSION: Improved bilateral airspace disease  Stable pleural changes in the right hemi thorax with chest tubes in place. No pneumothorax.  Electronically Signed   By: Maryclare Bean M.D.   On: 12/14/2013 08:02   Dg Chest Portable 1 View  12/13/2013   CLINICAL DATA:  Postop.  EXAM: PORTABLE CHEST - 1 VIEW  COMPARISON:  DG CHEST 1V PORT dated 12/12/2013; CT ANGIO CHEST W/CM &/OR WO/CM dated 12/11/2013; DG CHEST 1V PORT dated 12/11/2013; DG CHEST 2 VIEW dated 12/11/2013  FINDINGS: Patient is rotated. Right subclavian central line tip projects over the SVC. Two right chest tubes are in place with tips at the apex and base of the right hemi thorax. No definite pneumothorax. Extensive right lung airspace disease. Loculated right pleural  effusion, decreased in size. Small left pleural effusion.  IMPRESSION: 1. Interval placement of a right subclavian central line and 2 right chest tubes without pneumothorax. 2. Decrease in size of a loculated right pleural effusion. 3. Extensive right lung airspace disease.   Electronically Signed   By: Lorin Picket M.D.   On: 12/13/2013 13:54     Assessment/Plan: S/P Procedure(s) (LRB): VIDEO ASSISTED THORACOSCOPY (Right) DRAINAGE OF PLEURAL EFFUSION (Right) VIDEO BRONCHOSCOPY (Right) DECORTICATION Mobilize Continue ABX therapy due to preop infection infection     Kaylla Cobos B 12/14/2013 8:56 AM

## 2013-12-15 ENCOUNTER — Inpatient Hospital Stay (HOSPITAL_COMMUNITY): Payer: BC Managed Care – PPO

## 2013-12-15 ENCOUNTER — Encounter (HOSPITAL_COMMUNITY): Payer: Self-pay | Admitting: Cardiothoracic Surgery

## 2013-12-15 LAB — COMPREHENSIVE METABOLIC PANEL
ALT: 20 U/L (ref 0–53)
AST: 21 U/L (ref 0–37)
Albumin: 1.9 g/dL — ABNORMAL LOW (ref 3.5–5.2)
Alkaline Phosphatase: 134 U/L — ABNORMAL HIGH (ref 39–117)
BUN: 8 mg/dL (ref 6–23)
CALCIUM: 8.2 mg/dL — AB (ref 8.4–10.5)
CHLORIDE: 97 meq/L (ref 96–112)
CO2: 30 mEq/L (ref 19–32)
CREATININE: 0.97 mg/dL (ref 0.50–1.35)
GFR calc Af Amer: >90 mL/min (ref >90)
GFR calc non Af Amer: 90 mL/min — ABNORMAL LOW (ref >90)
Glucose, Bld: 107 mg/dL — ABNORMAL HIGH (ref 70–99)
Potassium: 4 mEq/L (ref 3.7–5.3)
SODIUM: 137 meq/L (ref 137–147)
Total Bilirubin: 0.5 mg/dL (ref 0.3–1.2)
Total Protein: 6.5 g/dL (ref 6.0–8.3)

## 2013-12-15 LAB — CBC
HCT: 37.4 % — ABNORMAL LOW (ref 39.0–52.0)
Hemoglobin: 12.6 g/dL — ABNORMAL LOW (ref 13.0–17.0)
MCH: 29.1 pg (ref 26.0–34.0)
MCHC: 33.7 g/dL (ref 30.0–36.0)
MCV: 86.4 fL (ref 78.0–100.0)
PLATELETS: 239 10*3/uL (ref 150–400)
RBC: 4.33 MIL/uL (ref 4.22–5.81)
RDW: 13.7 % (ref 11.5–15.5)
WBC: 18 10*3/uL — AB (ref 4.0–10.5)

## 2013-12-15 LAB — BODY FLUID CULTURE

## 2013-12-15 LAB — CULTURE, RESPIRATORY

## 2013-12-15 LAB — CULTURE, RESPIRATORY W GRAM STAIN: Culture: NO GROWTH

## 2013-12-15 MED ORDER — SODIUM CHLORIDE 0.9 % IJ SOLN
10.0000 mL | Freq: Two times a day (BID) | INTRAMUSCULAR | Status: DC
Start: 2013-12-15 — End: 2013-12-18
  Administered 2013-12-15 – 2013-12-18 (×3): 10 mL

## 2013-12-15 MED ORDER — SODIUM CHLORIDE 0.9 % IJ SOLN
10.0000 mL | INTRAMUSCULAR | Status: DC | PRN
  Administered 2013-12-16: 3 mL

## 2013-12-15 MED ORDER — GUAIFENESIN ER 600 MG PO TB12
600.0000 mg | ORAL_TABLET | Freq: Two times a day (BID) | ORAL | Status: DC
  Administered 2013-12-15 – 2013-12-18 (×7): 600 mg via ORAL
  Filled 2013-12-15 (×10): qty 1

## 2013-12-15 NOTE — Progress Notes (Signed)
PULMONARY / CRITICAL CARE MEDICINE  Name: Derek Pennington MRN: [id number] DOB: 05-01-1956    ADMISSION DATE:  12/11/2013 CONSULTATION DATE:  12/11/2013  REFERRING MD :  EDP PRIMARY SERVICE: PCCM  CHIEF COMPLAINT:  Shortness of breath  BRIEF PATIENT DESCRIPTION: 58 yo with RLL CAP and empyema.  SIGNIFICANT EVENTS / STUDIES:  2/23  CT chest >>> RLL consolidation with partially loculated R effusion  2/23  Thoracentesis >>> LDH 1116, WBC 10,627 with 85% segs 2/25  VATS procedure  LINES / TUBES: R chest tube Servando Snare ) 2/25 >>>  CULTURES: 2/23  U.Strep  >>> neg 2/23  U. Legionella  >>> neg 2/23  Pleural fluid  >>> MICROAEROPHILIC STREPTOCOCCI 3/81  Blood  >>>  2/25  Respiratory >>> 2/25  Pleural fluid >>>  ANTIBIOTICS: Vanc 2/23 x 1  Pip-tazo 2/23 x 1   Azithro 2/23 >>> 2/27 Ceftriaxone 2/23 >>>   SUBJECTIVE: No acute overnight events.  Likely d/c anterior chest tube today.  VITAL SIGNS: Temp:  [97.4 F (36.3 C)-98.6 F (37 C)] 98.5 F (36.9 C) (02/27 0731) Pulse Rate:  [84-117] 95 (02/27 0900) Resp:  [11-29] 14 (02/27 0900) BP: (111-160)/(51-96) 136/93 mmHg (02/27 0900) SpO2:  [92 %-100 %] 95 % (02/27 0900) Weight:  [109.7 kg (241 lb 13.5 oz)] 109.7 kg (241 lb 13.5 oz) (02/27 0500)  HEMODYNAMICS:   VENTILATOR SETTINGS:   INTAKE / OUTPUT: Intake/Output     02/26 0701 - 02/27 0700 02/27 0701 - 02/28 0700   P.O. 600    I.V. (mL/kg) 522.9 (4.8) 44 (0.4)   IV Piggyback 300    Total Intake(mL/kg) 1422.9 (13) 44 (0.4)   Urine (mL/kg/hr) 2015 (0.8)    Chest Tube 270 (0.1)    Total Output 2285     Net -862.1 +44          PHYSICAL EXAMINATION: General: resting comfortable in the chair  Neuro: Non focal HEENT: PERRL Cardiovascular: Regular, no murmurs Lungs: Bilateral air entry, no air leak Abdomen: Soft, non tender Ext: No edema.  Skin: Warm and dry  LABS:  CBC  Recent Labs Lab 12/12/13 2046 12/14/13 0410 12/15/13 0405  WBC 30.7* 20.3* 18.0*   HGB 15.0 12.2* 12.6*  HCT 43.3 36.4* 37.4*  PLT 222 221 239   Coag's  Recent Labs Lab 12/12/13 2046  APTT 34  INR 1.06   BMET  Recent Labs Lab 12/12/13 2046 12/14/13 0410 12/15/13 0405  NA 134* 134* 137  K 4.2 4.0 4.0  CL 94* 96 97  CO2 29 28 30   BUN 15 10 8   CREATININE 1.32 1.06 0.97  GLUCOSE 130* 143* 107*   Electrolytes  Recent Labs Lab 12/12/13 2046 12/14/13 0410 12/15/13 0405  CALCIUM 8.8 8.0* 8.2*   Sepsis Markers  Recent Labs Lab 12/11/13 1642 12/12/13 0220 12/12/13 2046  PROCALCITON 3.00 4.19 3.57   ABG  Recent Labs Lab 12/12/13 2235 12/14/13 0424  PHART 7.412 7.387  PCO2ART 41.8 50.8*  PO2ART 53.4* 68.0*   Liver Enzymes  Recent Labs Lab 12/12/13 2046 12/15/13 0405  AST 14 21  ALT 19 20  ALKPHOS 103 134*  BILITOT 0.8 0.5  ALBUMIN 2.5* 1.9*   Cardiac Enzymes No results found for this basename: TROPONINI, PROBNP,  in the last 168 hours Glucose  Recent Labs Lab 12/11/13 1809 12/12/13 12/12/13 0424 12/12/13 0830  GLUCAP 110* 146* 163* 143*   Imaging Dg Chest Port 1 View  12/15/2013   CLINICAL DATA:  Post VATS  EXAM: PORTABLE CHEST - 1 VIEW  COMPARISON:  DG CHEST 1V PORT dated 12/14/2013; DG CHEST 1V PORT dated 12/12/2013; DG CHEST 2 VIEW dated 12/11/2013; CT ANGIO CHEST W/CM &/OR WO/CM dated 12/11/2013  FINDINGS: Grossly unchanged enlarged cardiac silhouette and mediastinal contours given reduced lung volumes. Stable position of support apparatus. Additional support apparatus overlies the right lung apex and is favored to be external to the patient. No definite pneumothorax. Grossly unchanged small bilateral effusions and associated bilateral mid and lower lung heterogeneous opacities, right greater than left. Mild pulmonary venous congestion without frank evidence of edema. Grossly unchanged bones.  IMPRESSION: 1.  Stable positioning of support apparatus.  No pneumothorax. 2. Persistently reduced lung volumes with grossly unchanged  small bilateral effusions and associated bilateral mid and lower lung opacities, right greater the left, likely atelectasis and/or contusion. 3. Mild pulmonary venous congestion without frank evidence of edema.   Electronically Signed   By: Sandi Mariscal M.D.   On: 12/15/2013 07:45   Dg Chest Port 1 View  12/14/2013   CLINICAL DATA:  Pleural effusion  EXAM: PORTABLE CHEST - 1 VIEW  COMPARISON:  DG CHEST 1V PORT dated 12/13/2013  FINDINGS: Right chest tubes and right subclavian central venous catheter stable. Airspace disease throughout the right lung and left base are improved. Pleural changes in the right lung are not significantly changed. No pneumothorax. Cardiomegaly.  IMPRESSION: Improved bilateral airspace disease  Stable pleural changes in the right hemi thorax with chest tubes in place. No pneumothorax.   Electronically Signed   By: Maryclare Bean M.D.   On: 12/14/2013 08:02   Dg Chest Portable 1 View  12/13/2013   CLINICAL DATA:  Postop.  EXAM: PORTABLE CHEST - 1 VIEW  COMPARISON:  DG CHEST 1V PORT dated 12/12/2013; CT ANGIO CHEST W/CM &/OR WO/CM dated 12/11/2013; DG CHEST 1V PORT dated 12/11/2013; DG CHEST 2 VIEW dated 12/11/2013  FINDINGS: Patient is rotated. Right subclavian central line tip projects over the SVC. Two right chest tubes are in place with tips at the apex and base of the right hemi thorax. No definite pneumothorax. Extensive right lung airspace disease. Loculated right pleural effusion, decreased in size. Small left pleural effusion.  IMPRESSION: 1. Interval placement of a right subclavian central line and 2 right chest tubes without pneumothorax. 2. Decrease in size of a loculated right pleural effusion. 3. Extensive right lung airspace disease.   Electronically Signed   By: Lorin Picket M.D.   On: 12/13/2013 13:54   ASSESSMENT / PLAN:  PULMONARY  A:  Acute resp failure, resolved RLL CAP with MICROAEROPHILIC STREPTOCOCCI Empyema, s/p VATS P:   Supplemental O2 to maintain SpO2 > 90%   Abx as above  Chest tube management per TCTS Albuterol PRN  CARDIOVASCULAR  A:  HLD H/o Hypertension, now normotensive P:  Preadmission Simvastatin No antihypertensives noted on preadmission medication list  RENAL  A:  AKI, resolved P:  Trend BMP  GASTROINTESTINAL  A:  Nutrition GI Px is not indicated P:  Diet Bowel regimen while on opioids  HEMATOLOGIC  A:  Anemia of acute blood loss VTE Px P:  Trend CBC Enoxaparin/SCD's.   INFECTIOUS  A:  CAP  Empyema P:  Abx / cultures as above Azithromycin d/c'd  ENDOCRINE  A:  No acute issues P:  Monitor glucose on BMP.   NEUROLOGIC  A:  Post op pain P:  Ultram Oxycodone PCA  I have personally obtained history, examined patient, evaluated and interpreted laboratory  and imaging results, reviewed medical records, formulated assessment / plan and placed orders.  Doree Fudge, MD Pulmonary and [place] Pager: [phone]  12/15/2013, 9:46 AM

## 2013-12-15 NOTE — Discharge Instructions (Signed)
Video-Assisted Thoracic Surgery °Care After °Refer to this sheet in the next few weeks. These instructions provide you with information on caring for yourself after your procedure. Your caregiver may also give you more specific instructions. Your procedure has been planned according to current medical practices, but problems sometimes occur. Call your caregiver if you have any problems or questions after your procedure. °HOME CARE INSTRUCTIONS  °· Only take over-the-counter or prescription medications as directed. °· Only take pain medications (narcotics) as directed. °· Do not drive until your caregiver approves. Driving while taking narcotics or soon after surgery can be dangerous, so discuss the specific timing with your caregiver. °· Avoid activities that use your chest muscles, such as lifting heavy objects, for at least 3 4 weeks.   °· Take deep breaths to expand the lungs and to protect against pneumonia. °· Do breathing exercises as directed by your caregiver. If you were given an incentive spirometer to help with breathing, use it as directed. °· You may resume a normal diet and activities when you feel you are able to or as directed. °· Do not take a bath until your caregiver says it is OK. Use the shower instead.   °· Keep the bandage (dressing) covering the area where the chest tube was inserted (incision site) dry for 48 hours. After 48 hours, remove the dressing unless there is new drainage. °· Remove dressings as directed by your caregiver. °· Change dressings if necessary or as directed. °· Keep all follow-up appointments. It is important for you to see your caregiver after surgery to discuss appropriate follow-up care and surveillance, if it is necessary. °SEEK MEDICAL CARE: °· You feel excessive or increasing pain at an incision site. °· You notice bleeding, skin irritation, drainage, swelling, or redness at an incision site. °· There is a bad smell coming from an incision or dressing. °· It feels  like your heart is fluttering or beating rapidly. °· Your pain medication does not relieve your pain. °SEEK IMMEDIATE MEDICAL CARE IF:  °· You have a fever.   °· You have chest pain.  °· You have a rash. °· You have shortness of breath. °· You have trouble breathing.   °· You feel weak, lightheaded, dizzy, or faint.   °MAKE SURE YOU:  °· Understand these instructions.   °· Will watch your condition.   °· Will get help right away if you are not doing well or get worse. °Document Released: 01/30/2013 Document Reviewed: 01/30/2013 °[employer]® Patient Information ©2014 [employer]. ° °

## 2013-12-15 NOTE — Progress Notes (Signed)
Attempted to ambulate pt.  Pt was able to ambulate approx 48ft before stating "I can't do this" and "I need to sit down".  Pt refused to ambulate any further.  NT got recliner from pt room and pt was returned to room via recliner.  Pt VS remained stable.  Will continue to monitor.

## 2013-12-15 NOTE — Progress Notes (Signed)
Patient ID: Derek Pennington, male   DOB: July 09, 1956, 58 y.o.   MRN: [id number] TCTS DAILY ICU PROGRESS NOTE                   [address]            ,[place] [address]          [phone]   2 Days Post-Op Procedure(s) (LRB): VIDEO ASSISTED THORACOSCOPY (Right) DRAINAGE OF PLEURAL EFFUSION (Right) VIDEO BRONCHOSCOPY (Right) DECORTICATION  Total Length of Stay:  LOS: 4 days   Subjective: Little better pain control today still not walking much  Objective: Vital signs in last 24 hours: Temp:  [97.4 F (36.3 C)-98.6 F (37 C)] 98.5 F (36.9 C) (02/27 0731) Pulse Rate:  [84-117] 110 (02/27 0700) Cardiac Rhythm:  [-] Sinus tachycardia (02/27 0400) Resp:  [11-29] 15 (02/27 0700) BP: (111-160)/(51-96) 137/96 mmHg (02/27 0700) SpO2:  [90 %-100 %] 94 % (02/27 0700) Weight:  [241 lb 13.5 oz (109.7 kg)] 241 lb 13.5 oz (109.7 kg) (02/27 0500)  Filed Weights   12/12/13 0500 12/13/13 0359 12/15/13 0500  Weight: 240 lb 15.4 oz (109.3 kg) 241 lb 2.9 oz (109.4 kg) 241 lb 13.5 oz (109.7 kg)    Weight change:    Hemodynamic parameters for last 24 hours:    Intake/Output from previous day: 02/26 0701 - 02/27 0700 In: 1422.9 [P.O.:600; I.V.:522.9; IV Piggyback:300] Out: 2285 [Urine:2015; Chest Tube:270]  Intake/Output this shift:    Current Meds: Scheduled Meds: . azithromycin  500 mg Intravenous Q24H  . bisacodyl  10 mg Oral Daily  . cefTRIAXone (ROCEPHIN)  IV  1 g Intravenous Q24H  . enoxaparin (LOVENOX) injection  40 mg Subcutaneous Q24H  . fentaNYL   Intravenous 6 times per day  . simvastatin  40 mg Oral q1800   Continuous Infusions: . sodium chloride 20 mL/hr (12/14/13 1015)   PRN Meds:.albuterol, diphenhydrAMINE, diphenhydrAMINE, naloxone, ondansetron (ZOFRAN) IV, oxyCODONE-acetaminophen, potassium chloride, senna-docusate, sodium chloride, traMADol  General appearance: alert, cooperative and still with pain with ambulation Neurologic: intact Heart:  regular rate and rhythm, S1, S2 normal, no murmur, click, rub or gallop Lungs: diminished breath sounds RLL Abdomen: soft, non-tender; bowel sounds normal; no masses,  no organomegaly Extremities: extremities normal, atraumatic, no cyanosis or edema and Homans sign is negative, no sign of DVT Wound: intact  Lab Results: CBC:  Recent Labs  12/14/13 0410 12/15/13 0405  WBC 20.3* 18.0*  HGB 12.2* 12.6*  HCT 36.4* 37.4*  PLT 221 239   BMET:   Recent Labs  12/14/13 0410 12/15/13 0405  NA 134* 137  K 4.0 4.0  CL 96 97  CO2 28 30  GLUCOSE 143* 107*  BUN 10 8  CREATININE 1.06 0.97  CALCIUM 8.0* 8.2*    PT/INR:   Recent Labs  12/12/13 2046  LABPROT 13.6  INR 1.06   Radiology: Dg Chest Port 1 View  12/14/2013   CLINICAL DATA:  Pleural effusion  EXAM: PORTABLE CHEST - 1 VIEW  COMPARISON:  DG CHEST 1V PORT dated 12/13/2013  FINDINGS: Right chest tubes and right subclavian central venous catheter stable. Airspace disease throughout the right lung and left base are improved. Pleural changes in the right lung are not significantly changed. No pneumothorax. Cardiomegaly.  IMPRESSION: Improved bilateral airspace disease  Stable pleural changes in the right hemi thorax with chest tubes in place. No pneumothorax.   Electronically Signed   By: Maryclare Bean M.D.   On:  12/14/2013 08:02   Dg Chest Portable 1 View  12/13/2013   CLINICAL DATA:  Postop.  EXAM: PORTABLE CHEST - 1 VIEW  COMPARISON:  DG CHEST 1V PORT dated 12/12/2013; CT ANGIO CHEST W/CM &/OR WO/CM dated 12/11/2013; DG CHEST 1V PORT dated 12/11/2013; DG CHEST 2 VIEW dated 12/11/2013  FINDINGS: Patient is rotated. Right subclavian central line tip projects over the SVC. Two right chest tubes are in place with tips at the apex and base of the right hemi thorax. No definite pneumothorax. Extensive right lung airspace disease. Loculated right pleural effusion, decreased in size. Small left pleural effusion.  IMPRESSION: 1. Interval placement of  a right subclavian central line and 2 right chest tubes without pneumothorax. 2. Decrease in size of a loculated right pleural effusion. 3. Extensive right lung airspace disease.   Electronically Signed   By: Lorin Picket M.D.   On: 12/13/2013 13:54     Assessment/Plan: S/P Procedure(s) (LRB): VIDEO ASSISTED THORACOSCOPY (Right) DRAINAGE OF PLEURAL EFFUSION (Right) VIDEO BRONCHOSCOPY (Right) DECORTICATION Mobilize D/c one chest tube Continue ABX therapy due to pre op infection with strep   Ande Therrell B 12/15/2013 7:37 AM

## 2013-12-16 ENCOUNTER — Inpatient Hospital Stay (HOSPITAL_COMMUNITY): Payer: BC Managed Care – PPO

## 2013-12-16 LAB — BODY FLUID CULTURE: Culture: NO GROWTH

## 2013-12-16 LAB — CBC
HCT: 37.8 % — ABNORMAL LOW (ref 39.0–52.0)
Hemoglobin: 12.7 g/dL — ABNORMAL LOW (ref 13.0–17.0)
MCH: 28.9 pg (ref 26.0–34.0)
MCHC: 33.6 g/dL (ref 30.0–36.0)
MCV: 86.1 fL (ref 78.0–100.0)
Platelets: 255 10*3/uL (ref 150–400)
RBC: 4.39 MIL/uL (ref 4.22–5.81)
RDW: 13.6 % (ref 11.5–15.5)
WBC: 15.3 10*3/uL — ABNORMAL HIGH (ref 4.0–10.5)

## 2013-12-16 LAB — BASIC METABOLIC PANEL
BUN: 10 mg/dL (ref 6–23)
CO2: 31 mEq/L (ref 19–32)
Calcium: 8.3 mg/dL — ABNORMAL LOW (ref 8.4–10.5)
Chloride: 95 mEq/L — ABNORMAL LOW (ref 96–112)
Creatinine, Ser: 1.02 mg/dL (ref 0.50–1.35)
GFR calc Af Amer: >90 mL/min (ref >90)
GFR calc non Af Amer: 79 mL/min — ABNORMAL LOW (ref >90)
Glucose, Bld: 116 mg/dL — ABNORMAL HIGH (ref 70–99)
Potassium: 3.9 mEq/L (ref 3.7–5.3)
Sodium: 137 mEq/L (ref 137–147)

## 2013-12-16 MED ORDER — HYDROCOD POLST-CHLORPHEN POLST 10-8 MG/5ML PO LQCR
5.0000 mL | Freq: Two times a day (BID) | ORAL | Status: DC | PRN
  Administered 2013-12-17 (×2): 5 mL via ORAL
  Filled 2013-12-16 (×2): qty 5

## 2013-12-16 NOTE — Progress Notes (Signed)
PULMONARY / CRITICAL CARE MEDICINE  Name: Derek Pennington MRN: [id number] DOB: 09-Mar-1956    ADMISSION DATE:  12/11/2013 CONSULTATION DATE:  12/11/2013  REFERRING MD :  EDP PRIMARY SERVICE: PCCM  CHIEF COMPLAINT:  Shortness of breath  BRIEF PATIENT DESCRIPTION: 58 yo with RLL CAP and empyema.  SIGNIFICANT EVENTS / STUDIES:  2/23  CT chest >>> RLL consolidation with partially loculated R effusion  2/23  Thoracentesis >>> LDH 1116, WBC 10,627 with 85% segs 2/25  VATS procedure  LINES / TUBES: R chest tube 1 ( Gerhardt ) 2/25 >>> 2/27 R chest tube 2 (Gerhardt) 2/25 >>> R [place] CVL 2/25 >>>  CULTURES: 2/23  U.Strep  >>> neg 2/23  U. Legionella  >>> neg 2/23  Pleural fluid  >>> MICROAEROPHILIC STREPTOCOCCI 6/43  Blood  >>>  2/25  Respiratory >>> negative 2/25  Pleural fluid >>>  ANTIBIOTICS: Vanc 2/23 x 1  Pip-tazo 2/23 x 1   Azithro 2/23 >>> 2/27 Ceftriaxone 2/23 >>>   SUBJECTIVE:  Still needing frequent pain med doses from PCA.  C/o feeling bloated >> passing flatus.  Not as much cough or sputum  VITAL SIGNS: Temp:  [98.7 F (37.1 C)-100.5 F (38.1 C)] 100.2 F (37.9 C) (02/28 0400) Pulse Rate:  [83-125] 90 (02/28 0700) Resp:  [10-30] 13 (02/28 0700) BP: (118-156)/(74-96) 133/91 mmHg (02/28 0700) SpO2:  [92 %-100 %] 95 % (02/28 0700) Weight:  [239 lb 3.2 oz (108.5 kg)] 239 lb 3.2 oz (108.5 kg) (02/28 0600)  INTAKE / OUTPUT: Intake/Output     02/27 0701 - 02/28 0700 02/28 0701 - 03/01 0700   P.O. 2390    I.V. (mL/kg) 630.8 (5.8)    IV Piggyback     Total Intake(mL/kg) 3020.8 (27.8)    Urine (mL/kg/hr) 1500 (0.6)    Chest Tube 150 (0.1)    Total Output 1650     Net +1370.8            PHYSICAL EXAMINATION: General: resting comfortable in the chair  Neuro: Normal strength HEENT: No sinus tenderness Cardiovascular: Regular, no murmurs Lungs: Scattered rhonchi, no wheeze, Rt chest tube in place Abdomen: Soft, mild distention, + bowel sounds Ext: No  edema Skin: Warm and dry  LABS:  CBC  Recent Labs Lab 12/14/13 0410 12/15/13 0405 12/16/13 0340  WBC 20.3* 18.0* 15.3*  HGB 12.2* 12.6* 12.7*  HCT 36.4* 37.4* 37.8*  PLT 221 239 255   Coag's  Recent Labs Lab 12/12/13 2046  APTT 34  INR 1.06   BMET  Recent Labs Lab 12/14/13 0410 12/15/13 0405 12/16/13 0340  NA 134* 137 137  K 4.0 4.0 3.9  CL 96 97 95*  CO2 28 30 31   BUN 10 8 10   CREATININE 1.06 0.97 1.02  GLUCOSE 143* 107* 116*   Electrolytes  Recent Labs Lab 12/14/13 0410 12/15/13 0405 12/16/13 0340  CALCIUM 8.0* 8.2* 8.3*   Sepsis Markers  Recent Labs Lab 12/11/13 1642 12/12/13 0220 12/12/13 2046  PROCALCITON 3.00 4.19 3.57   ABG  Recent Labs Lab 12/12/13 2235 12/14/13 0424  PHART 7.412 7.387  PCO2ART 41.8 50.8*  PO2ART 53.4* 68.0*   Liver Enzymes  Recent Labs Lab 12/12/13 2046 12/15/13 0405  AST 14 21  ALT 19 20  ALKPHOS 103 134*  BILITOT 0.8 0.5  ALBUMIN 2.5* 1.9*   Glucose  Recent Labs Lab 12/11/13 1809 12/12/13 12/12/13 0424 12/12/13 0830  GLUCAP 110* 146* 163* 143*   Imaging Dg Chest Port 1 [address]  12/15/2013   CLINICAL DATA:  Post VATS  EXAM: PORTABLE CHEST - 1 VIEW  COMPARISON:  DG CHEST 1V PORT dated 12/14/2013; DG CHEST 1V PORT dated 12/12/2013; DG CHEST 2 VIEW dated 12/11/2013; CT ANGIO CHEST W/CM &/OR WO/CM dated 12/11/2013  FINDINGS: Grossly unchanged enlarged cardiac silhouette and mediastinal contours given reduced lung volumes. Stable position of support apparatus. Additional support apparatus overlies the right lung apex and is favored to be external to the patient. No definite pneumothorax. Grossly unchanged small bilateral effusions and associated bilateral mid and lower lung heterogeneous opacities, right greater than left. Mild pulmonary venous congestion without frank evidence of edema. Grossly unchanged bones.  IMPRESSION: 1.  Stable positioning of support apparatus.  No pneumothorax. 2. Persistently reduced  lung volumes with grossly unchanged small bilateral effusions and associated bilateral mid and lower lung opacities, right greater the left, likely atelectasis and/or contusion. 3. Mild pulmonary venous congestion without frank evidence of edema.   Electronically Signed   By: Sandi Mariscal M.D.   On: 12/15/2013 07:45   ASSESSMENT / PLAN:  PULMONARY  A:  Acute resp failure, resolved RLL CAP with MICROAEROPHILIC STREPTOCOCCI Empyema, s/p VATS P:   Supplemental O2 to maintain SpO2 > 90%  Chest tube management per TCTS Albuterol PRN F/u CXR  CARDIOVASCULAR  A:  HLD H/o Hypertension, now normotensive P:  Monitor hemodynamics Keep CVL in place for now Continue simvastatin  RENAL  A:  AKI, resolved P:  Trend BMP  GASTROINTESTINAL  A:  Nutrition GI Px is not indicated P:  Diet Bowel regimen while on opioids  HEMATOLOGIC  A:  Anemia of acute blood loss VTE Px P:  Trend CBC Enoxaparin/SCD's.   INFECTIOUS  A:  CAP  Empyema P:  Day 6 of Abx, currently on rocephin  ENDOCRINE  A:  No acute issues P:  Monitor glucose on BMP.   NEUROLOGIC  A:  Post op pain P:  Ultram Oxycodone PCA  Summary: Continues to improve.  Defer timing of d/c 2nd Rt chest tube to TCTS.  Likely ready to transfer to SDU >> defer to TCTS.  Need to monitor for ileus with frequent doses of opiates from PCA.  Chesley Mires, MD [facility] Pulmonary/Critical Care 12/16/2013, 7:51 AM Pager:  [phone] After 3pm call: [phone]

## 2013-12-16 NOTE — Progress Notes (Signed)
Removed posterior pleural chest tube.  Pt was put back to bed and tolerated well.

## 2013-12-16 NOTE — Progress Notes (Signed)
3 Days Post-Op Procedure(s) (LRB): VIDEO ASSISTED THORACOSCOPY (Right) DRAINAGE OF PLEURAL EFFUSION (Right) VIDEO BRONCHOSCOPY (Right) DECORTICATION Subjective:  Sore when he coughs. Walked this am.  Objective: Vital signs in last 24 hours: Temp:  [98.7 F (37.1 C)-100.5 F (38.1 C)] 100.2 F (37.9 C) (02/28 0400) Pulse Rate:  [79-125] 94 (02/28 1100) Cardiac Rhythm:  [-] Normal sinus rhythm (02/28 0800) Resp:  [10-30] 21 (02/28 1100) BP: (118-156)/(74-96) 142/78 mmHg (02/28 1100) SpO2:  [90 %-100 %] 99 % (02/28 1100) Weight:  [108.5 kg (239 lb 3.2 oz)] 108.5 kg (239 lb 3.2 oz) (02/28 0600)  Hemodynamic parameters for last 24 hours:    Intake/Output from previous day: 02/27 0701 - 02/28 0700 In: 3020.8 [P.O.:2390; I.V.:630.8] Out: 1650 [Urine:1500; Chest Tube:150] Intake/Output this shift: Total I/O In: 80 [I.V.:80] Out: -   General appearance: alert and cooperative Heart: regular rate and rhythm, S1, S2 normal, no murmur, click, rub or gallop Lungs: clear to auscultation bilaterally Wound: dressing dry Chest tube has no air leak and minimal drainage.  Lab Results:  Recent Labs  12/15/13 0405 12/16/13 0340  WBC 18.0* 15.3*  HGB 12.6* 12.7*  HCT 37.4* 37.8*  PLT 239 255   BMET:  Recent Labs  12/15/13 0405 12/16/13 0340  NA 137 137  K 4.0 3.9  CL 97 95*  CO2 30 31  GLUCOSE 107* 116*  BUN 8 10  CREATININE 0.97 1.02  CALCIUM 8.2* 8.3*    PT/INR: No results found for this basename: LABPROT, INR,  in the last 72 hours ABG    Component Value Date/Time   PHART 7.387 12/14/2013 0424   HCO3 30.6* 12/14/2013 0424   TCO2 32 12/14/2013 0424   O2SAT 93.0 12/14/2013 0424   CBG (last 3)  No results found for this basename: GLUCAP,  in the last 72 hours  CLINICAL DATA: Chest tube  EXAM:  PORTABLE CHEST - 1 VIEW  COMPARISON: DG CHEST 1V PORT dated 12/15/2013; DG CHEST 1V PORT  dated 12/12/2013; DG CHEST 2 VIEW dated 12/11/2013; CT ANGIO CHEST  W/CM &/OR WO/CM  dated 12/11/2013  FINDINGS:  Grossly unchanged enlarged cardiac silhouette and mediastinal  contours given reduced lung volumes. Stable positioning of support  apparatus. Unchanged small right-sided pleural effusion and  associated right basilar heterogeneous opacities. Improved aeration  of the left lower lung. Pulmonary venous congestion without definite  evidence of edema. No pneumothorax. Unchanged bones.  IMPRESSION:  1. Stable positioning of support apparatus. No pneumothorax.  2. Grossly unchanged small right-sided effusion associated right mid  and lower lung opacities, atelectasis versus infiltrate.  3. Improved aeration of the left lung.  4. Cardiomegaly and pulmonary venous congestion without evidence of  edema.  Electronically Signed  By: Sandi Mariscal M.D.  On: 12/16/2013 08:50  Assessment/Plan: S/P Procedure(s) (LRB): VIDEO ASSISTED THORACOSCOPY (Right) DRAINAGE OF PLEURAL EFFUSION (Right) VIDEO BRONCHOSCOPY (Right) DECORTICATION  Remove remaining chest tube Continue Rocephin. Operative culture negative but prior fluid culture grew microaerophilic strep. Continue IS and ambulation Transfer to 2W   LOS: 5 days    Derek Pennington K 12/16/2013

## 2013-12-17 ENCOUNTER — Inpatient Hospital Stay (HOSPITAL_COMMUNITY): Payer: BC Managed Care – PPO

## 2013-12-17 NOTE — Progress Notes (Addendum)
      [address]       ,[place] [address]             [phone]       4 Days Post-Op Procedure(s) (LRB): VIDEO ASSISTED THORACOSCOPY (Right) DRAINAGE OF PLEURAL EFFUSION (Right) VIDEO BRONCHOSCOPY (Right) DECORTICATION  Subjective: Patient with complaints of drainage from right chest tube sites.  Objective: Vital signs in last 24 hours: Temp:  [97.9 F (36.6 C)-99.6 F (37.6 C)] 98.8 F (37.1 C) (03/01 0448) Pulse Rate:  [79-108] 108 (03/01 0448) Cardiac Rhythm:  [-] Normal sinus rhythm (02/28 2013) Resp:  [11-23] 18 (03/01 0448) BP: (121-156)/(67-93) 121/67 mmHg (03/01 0448) SpO2:  [88 %-99 %] 95 % (03/01 0448) Weight:  [107.6 kg (237 lb 3.4 oz)] 107.6 kg (237 lb 3.4 oz) (03/01 0448)     Intake/Output from previous day: 02/28 0701 - 03/01 0700 In: 100 [I.V.:100] Out: 750 [Urine:750]   Physical Exam:  Cardiovascular: Slightly tachy Pulmonary: Clear to auscultation on left;diminished right base; no rales, wheezes, or rhonchi. Abdomen: Soft, non tender, bowel sounds present. Extremities: Mild bilateral lower extremity edema. Wounds: Clean and dry.  No erythema or signs of infection. Posterior chest tube site with clearish oozing. Dressings all changed.  Lab Results: CBC: Recent Labs  12/15/13 0405 12/16/13 0340  WBC 18.0* 15.3*  HGB 12.6* 12.7*  HCT 37.4* 37.8*  PLT 239 255   BMET:  Recent Labs  12/15/13 0405 12/16/13 0340  NA 137 137  K 4.0 3.9  CL 97 95*  CO2 30 31  GLUCOSE 107* 116*  BUN 8 10  CREATININE 0.97 1.02  CALCIUM 8.2* 8.3*    PT/INR: No results found for this basename: LABPROT, INR,  in the last 72 hours ABG:  INR: Will add last result for INR, ABG once components are confirmed Will add last 4 CBG results once components are confirmed  Assessment/Plan:  1. CV - SR/ST 2.  Pulmonary - Chest tubes have been removed. CXR appears to show no pnuemtohorax, small right pleural effusion and atelectasis at right  base, and improved pulmonary congestion..Right pleural fluid no growth to date. Prior culture was positive for Microaerophilic Strep-on Rocephin. 3. Management per pulmonary  ZIMMERMAN,DONIELLE MPA-C 12/17/2013,8:42 AM  Agree with above. CXR shows pleural thickening on the right. Continue mobilization and IS.

## 2013-12-17 NOTE — Progress Notes (Signed)
PULMONARY / CRITICAL CARE MEDICINE  Name: Derek Pennington MRN: [id number] DOB: 05/27/1956    ADMISSION DATE:  12/11/2013 CONSULTATION DATE:  12/11/2013  REFERRING MD :  EDP PRIMARY SERVICE: PCCM  CHIEF COMPLAINT:  Shortness of breath  BRIEF PATIENT DESCRIPTION: 58 yo with RLL CAP and empyema.  SIGNIFICANT EVENTS / STUDIES:  2/23  CT chest >>> RLL consolidation with partially loculated R effusion  2/23  Thoracentesis >>> LDH 1116, WBC 10,627 with 85% segs 2/25  VATS procedure  LINES / TUBES: R chest tube 1 ( Gerhardt ) 2/25 >>> 2/27 R chest tube 2 (Gerhardt) 2/25 >>> R [place] CVL 2/25 >>>  CULTURES: 2/23  U.Strep  >>> neg 2/23  U. Legionella  >>> neg 2/23  Pleural fluid  >>> MICROAEROPHILIC STREPTOCOCCI 0/73  Blood  >>>  2/25  Respiratory >>> negative 2/25  Pleural fluid >>>  ANTIBIOTICS: Vanc 2/23 x 1  Pip-tazo 2/23 x 1   Azithro 2/23 >>> 2/27 Ceftriaxone 2/23 >>>   SUBJECTIVE:  Feels breathing is ok, no increased wob or distress.  Still coughing up discolored mucus.   VITAL SIGNS: Temp:  [98.7 F (37.1 C)-99.6 F (37.6 C)] 98.8 F (37.1 C) (03/01 0448) Pulse Rate:  [92-108] 108 (03/01 0448) Resp:  [11-18] 18 (03/01 0448) BP: (121-156)/(67-83) 121/67 mmHg (03/01 0448) SpO2:  [88 %-95 %] 95 % (03/01 0448) Weight:  [107.6 kg (237 lb 3.4 oz)] 107.6 kg (237 lb 3.4 oz) (03/01 0448)  INTAKE / OUTPUT: Intake/Output     02/28 0701 - 03/01 0700 03/01 0701 - 03/02 0700   P.O.     I.V. (mL/kg) 100 (0.9)    Total Intake(mL/kg) 100 (0.9)    Urine (mL/kg/hr) 750 (0.3)    Chest Tube     Total Output 750     Net -650          Urine Occurrence 2 x      PHYSICAL EXAMINATION: General: resting comfortable in the chair  Neuro: alert, moves all 4 HEENT: nose without purulence or d/c, no TMG or LN Cardiovascular: rrr, no murmurs noted Lungs: decreased bs and crackles right base, left clear.  Abdomen: Soft, mild distention, + bowel sounds Ext: No edema or cyanosis.    LABS:  CBC  Recent Labs Lab 12/14/13 0410 12/15/13 0405 12/16/13 0340  WBC 20.3* 18.0* 15.3*  HGB 12.2* 12.6* 12.7*  HCT 36.4* 37.4* 37.8*  PLT 221 239 255   Coag's  Recent Labs Lab 12/12/13 2046  APTT 34  INR 1.06   BMET  Recent Labs Lab 12/14/13 0410 12/15/13 0405 12/16/13 0340  NA 134* 137 137  K 4.0 4.0 3.9  CL 96 97 95*  CO2 28 30 31   BUN 10 8 10   CREATININE 1.06 0.97 1.02  GLUCOSE 143* 107* 116*   Electrolytes  Recent Labs Lab 12/14/13 0410 12/15/13 0405 12/16/13 0340  CALCIUM 8.0* 8.2* 8.3*   Sepsis Markers  Recent Labs Lab 12/11/13 1642 12/12/13 0220 12/12/13 2046  PROCALCITON 3.00 4.19 3.57   ABG  Recent Labs Lab 12/12/13 2235 12/14/13 0424  PHART 7.412 7.387  PCO2ART 41.8 50.8*  PO2ART 53.4* 68.0*   Liver Enzymes  Recent Labs Lab 12/12/13 2046 12/15/13 0405  AST 14 21  ALT 19 20  ALKPHOS 103 134*  BILITOT 0.8 0.5  ALBUMIN 2.5* 1.9*   Glucose  Recent Labs Lab 12/11/13 1809 12/12/13 12/12/13 0424 12/12/13 0830  GLUCAP 110* 146* 163* 143*   Imaging Dg Chest 2  View  12/17/2013   CLINICAL DATA:  Status post right thoracotomy and drainage of empyema.  EXAM: CHEST  2 VIEW  COMPARISON:  DG CHEST 1V PORT dated 12/16/2013  FINDINGS: Right-sided subclavian line unchanged. Removal of right chest tube. Mild cardiomegaly. Small right pleural effusion is similar. No pneumothorax. Improved pulmonary venous congestion. Mild left base scarring or atelectasis. Slight improvement in right base airspace disease.  IMPRESSION: Removal of right-sided chest tube, without pneumothorax.  Similar right sided pleural effusion with improved adjacent atelectasis or infection.  Improved pulmonary venous congestion.   Electronically Signed   By: Abigail Miyamoto M.D.   On: 12/17/2013 09:02   Dg Chest Port 1 View  12/16/2013   CLINICAL DATA:  Chest tube  EXAM: PORTABLE CHEST - 1 VIEW  COMPARISON:  DG CHEST 1V PORT dated 12/15/2013; DG CHEST 1V PORT  dated 12/12/2013; DG CHEST 2 VIEW dated 12/11/2013; CT ANGIO CHEST W/CM &/OR WO/CM dated 12/11/2013  FINDINGS: Grossly unchanged enlarged cardiac silhouette and mediastinal contours given reduced lung volumes. Stable positioning of support apparatus. Unchanged small right-sided pleural effusion and associated right basilar heterogeneous opacities. Improved aeration of the left lower lung. Pulmonary venous congestion without definite evidence of edema. No pneumothorax. Unchanged bones.  IMPRESSION: 1.  Stable positioning of support apparatus.  No pneumothorax. 2. Grossly unchanged small right-sided effusion associated right mid and lower lung opacities, atelectasis versus infiltrate. 3. Improved aeration of the left lung. 4. Cardiomegaly and pulmonary venous congestion without evidence of edema.   Electronically Signed   By: Sandi Mariscal M.D.   On: 12/16/2013 08:50   ASSESSMENT / PLAN:  PULMONARY  A:  Acute resp failure, resolved RLL CAP with MICROAEROPHILIC STREPTOCOCCI Empyema, s/p VATS P:   Supplemental O2 to maintain SpO2 > 90%  Chest tube out mobilize  HEMATOLOGIC  A:  Anemia of acute blood loss VTE Px P:  Trend CBC Enoxaparin/SCD's.   INFECTIOUS  A:  CAP  Empyema P:  currently on rocephin

## 2013-12-18 ENCOUNTER — Inpatient Hospital Stay (HOSPITAL_COMMUNITY): Payer: BC Managed Care – PPO

## 2013-12-18 DIAGNOSIS — J45909 Unspecified asthma, uncomplicated: Secondary | ICD-10-CM

## 2013-12-18 DIAGNOSIS — J90 Pleural effusion, not elsewhere classified: Secondary | ICD-10-CM

## 2013-12-18 LAB — CULTURE, BLOOD (ROUTINE X 2)
CULTURE: NO GROWTH
Culture: NO GROWTH

## 2013-12-18 MED ORDER — BISACODYL 5 MG PO TBEC: 10.0000 mg | DELAYED_RELEASE_TABLET | Freq: Every day | ORAL | Status: DC | PRN

## 2013-12-18 MED ORDER — AMOXICILLIN-POT CLAVULANATE 875-125 MG PO TABS
1.0000 | ORAL_TABLET | Freq: Two times a day (BID) | ORAL | Status: DC
Start: 2013-12-18 — End: 2013-12-18
  Administered 2013-12-18: 1 via ORAL
  Filled 2013-12-18 (×2): qty 1

## 2013-12-18 MED ORDER — GUAIFENESIN ER 600 MG PO TB12: 600.0000 mg | ORAL_TABLET | Freq: Two times a day (BID) | ORAL | Status: DC

## 2013-12-18 MED ORDER — OXYCODONE-ACETAMINOPHEN 5-325 MG PO TABS: 1.0000 | ORAL_TABLET | ORAL | Status: DC | PRN

## 2013-12-18 MED ORDER — BISACODYL 10 MG RE SUPP
10.0000 mg | Freq: Once | RECTAL | Status: DC
  Filled 2013-12-18: qty 1

## 2013-12-18 MED ORDER — AMOXICILLIN-POT CLAVULANATE 875-125 MG PO TABS: 1.0000 | ORAL_TABLET | Freq: Two times a day (BID) | ORAL | Status: AC

## 2013-12-18 NOTE — Discharge Summary (Signed)
Physician Discharge Summary  Patient ID: Derek Pennington MRN: [id number] DOB/AGE: Nov 22, 1955 58 y.o.  Admit date: 12/11/2013 Discharge date: 12/18/2013    Discharge Diagnoses:  Active Problems:   Pneumonia   Acute respiratory failure with hypoxia   Empyema of right pleural space   Empyema, right    Brief Summary: Raeshawn Vo is a 58 y.o. y/o male with minimal PMH who presented 2/23 with cough x 3 weeks, SOB, pleuritic chest pain and fevers.  Prior to admit was treated as outpt by his PCP for reactive airway disease with steroids but s/s continued to worsen.  Admitted with presumed CAP and R pleural effusion and placed on abx. Thoracentesis done 2/23 with 251m turbid fluid and >10k WBC, culture pos for microaerophilic strep .  Pt continued to have severe R sided pleuritic pain and cough and CT chest showed loculated R effusion.  Pt underwent VATS decortication of R empyema on 2/25.  He tolerated well, chest tubes were removed 2/27 and 2/28.  Pt has remained on IV rocephin and will be transitioned to PO Augmentin on d/c for total 2 weeks abx.  Now much improved, ambulating in halls without difficulty, pain well controlled and ready for d/c home with close outpt f/u including f/u CXR and CVTS f/u.     SIGNIFICANT EVENTS / STUDIES:  2/23 CT chest >>> RLL consolidation with partially loculated R effusion  2/23 Thoracentesis >>> LDH 1116, WBC 10,627 with 85% segs  2/25 VATS procedure   LINES / TUBES:  R chest tube 1 ( Gerhardt ) 2/25 >>> 2/27  R chest tube 2 (Gerhardt) 2/25 >>> 2/28 R [place] CVL 2/25 >>>   CULTURES:  2/23 U.Strep >>> neg  2/23 U. Legionella >>> neg  2/23 Pleural fluid >>> MICROAEROPHILIC STREPTOCOCCI  25/63Blood >>> neg 2/25 Respiratory >>> negative  2/25 Pleural fluid >>>   ANTIBIOTICS:  Vanc 2/23 x 1  Pip-tazo 2/23 x 1  Azithro 2/23 >>> 2/27  Ceftriaxone 2/23 >>>3/2 augmentin 3/2>>> total 2 weeks abx          Discharge Plan by Discharge Diagnosis Acute resp  failure, resolved  RLL CAP with MICROAEROPHILIC STREPTOCOCCI  Empyema, s/p VATS  P:  outpt f/u per CVTS with CXR Chest tubes out  mobilize  Pain control  Pulmonary hygiene  Surgical site care per CVTS instructions   CAP  Empyema - pleural fluid with MICROAEROPHILIC STREPTOCOCCI P:  Change to Augmentin for dc for total 2 weeks abx  pcp f/u     Filed Vitals:   12/17/13 0448 12/17/13 1328 12/17/13 2000 12/18/13 0428  BP: 121/67 137/78 152/90 124/80  Pulse: 108 91 101 90  Temp: 98.8 F (37.1 C) 97.7 F (36.5 C) 99.5 F (37.5 C) 99.5 F (37.5 C)  TempSrc: Oral Oral Oral Oral  Resp: '18 18 18 18  ' Height:      Weight: 237 lb 3.4 oz (107.6 kg)     SpO2: 95% 98% 93% 95%     Discharge Labs  BMET  Recent Labs Lab 12/12/13 0220 12/12/13 2046 12/14/13 0410 12/15/13 0405 12/16/13 0340  NA 133* 134* 134* 137 137  K 4.3 4.2 4.0 4.0 3.9  CL 93* 94* 96 97 95*  CO2 '27 29 28 30 31  ' GLUCOSE 145* 130* 143* 107* 116*  BUN '18 15 10 8 10  ' CREATININE 1.44* 1.32 1.06 0.97 1.02  CALCIUM 8.5 8.8 8.0* 8.2* 8.3*     CBC   Recent Labs Lab 12/14/13 0410  12/15/13 0405 12/16/13 0340  HGB 12.2* 12.6* 12.7*  HCT 36.4* 37.4* 37.8*  WBC 20.3* 18.0* 15.3*  PLT 221 239 255   Anti-Coagulation  Recent Labs Lab 12/12/13 2046  INR 1.06       Future Appointments Provider Department Dept Phone   01/04/2014 10:00 AM Grace Isaac, MD [facility] [phone]           Follow-up Information   Follow up with GERHARDT,EDWARD B, MD On 01/04/2014. (Appointment is at 10:00)    Specialty:  Cardiothoracic Surgery   Contact information:   [place] [address] [phone]       Follow up with [facility] On 01/04/2014.   Contact information:   [place]       Follow up with Chancy Hurter, MD. Schedule an appointment as soon as possible for a visit in 1 week.   Specialties:  Internal Medicine,  Radiology   Contact information:   [place] [address] [phone]       Follow up with Grace Isaac, MD On 01/04/2014. (10am - chest xray prior )    Specialty:  Cardiothoracic Surgery   Contact information:   [address] [place] [address] [phone]          Medication List    STOP taking these medications       CIALIS 20 MG tablet  Generic drug:  tadalafil     predniSONE 20 MG tablet  Commonly known as:  DELTASONE      TAKE these medications       amoxicillin-clavulanate 875-125 MG per tablet  Commonly known as:  AUGMENTIN  Take 1 tablet by mouth every 12 (twelve) hours.     bisacodyl 5 MG EC tablet  Commonly known as:  DULCOLAX  Take 2 tablets (10 mg total) by mouth daily as needed for moderate constipation.     guaiFENesin 600 MG 12 hr tablet  Commonly known as:  MUCINEX  Take 1 tablet (600 mg total) by mouth 2 (two) times daily.     HYDROcodone-homatropine 5-1.5 MG/5ML syrup  Commonly known as:  HYCODAN  Take 5 mLs by mouth every 8 (eight) hours as needed for cough.     nitroGLYCERIN 0.4 MG SL tablet  Commonly known as:  NITROSTAT  Place 0.4 mg under the tongue every 5 (five) minutes as needed for chest pain.     oxyCODONE-acetaminophen 5-325 MG per tablet  Commonly known as:  PERCOCET/ROXICET  Take 1-2 tablets by mouth every 4 (four) hours as needed for severe pain.     simvastatin 40 MG tablet  Commonly known as:  ZOCOR  Take 1 tablet (40 mg total) by mouth at bedtime.     Testosterone 30 MG/ACT Soln  Place 1 application onto the skin daily.          Disposition:  Home   Discharged Condition: Arval Brandstetter has met maximum benefit of inpatient care and is medically stable and cleared for discharge.  Patient is pending follow up as above.      Time spent on disposition:  Greater than 35 minutes.   SignedDarlina Sicilian, NP 12/18/2013  11:53 AM Pager: [phone] or ([phone]  [phone]  *Care during the described time interval was provided by me and/or other providers on the critical care team. I have reviewed this patient's available data, including medical history, events of note, physical examination and  test results as part of my evaluation.  Chesley Mires, MD [facility] Pulmonary/Critical Care 12/18/2013, 12:15 PM Pager:  [phone] After 3pm call: [phone]

## 2013-12-18 NOTE — Progress Notes (Signed)
Assessment unchanged. Discussed D/C instructions with pt including follow up appointments and medications. IV and tele removed.  Pt left via wheelchair with belongings.

## 2013-12-18 NOTE — Progress Notes (Signed)
Central line removed per order. Pressure held. Occlusive dressing applied. Pt informed of bedrest. Will continue to monitor to pt closely.

## 2013-12-18 NOTE — Progress Notes (Signed)
[address]       [place] [address]             [phone]      5 Days Post-Op  Procedure(s) (LRB): VIDEO ASSISTED THORACOSCOPY (Right) DRAINAGE OF PLEURAL EFFUSION (Right) VIDEO BRONCHOSCOPY (Right) DECORTICATION Subjective: Some chest wall discomfort, especially with cough  Objective  Telemetry sinus tachy  Temp:  [97.7 F (36.5 C)-99.5 F (37.5 C)] 99.5 F (37.5 C) (03/02 0428) Pulse Rate:  [90-101] 90 (03/02 0428) Resp:  [18] 18 (03/02 0428) BP: (124-152)/(78-90) 124/80 mmHg (03/02 0428) SpO2:  [93 %-98 %] 95 % (03/02 0428)   Intake/Output Summary (Last 24 hours) at 12/18/13 1153 Last data filed at 12/18/13 0730  Gross per 24 hour  Intake    960 ml  Output    450 ml  Net    510 ml       General appearance: alert, cooperative and no distress Heart: regular rate and rhythm and tachy with activity Lungs: dim in bases Abdomen: + BS, soft, + distension Extremities: no edema Wound: incis healing well  Lab Results:  Recent Labs  12/16/13 0340  NA 137  K 3.9  CL 95*  CO2 31  GLUCOSE 116*  BUN 10  CREATININE 1.02  CALCIUM 8.3*   No results found for this basename: AST, ALT, ALKPHOS, BILITOT, PROT, ALBUMIN,  in the last 72 hours No results found for this basename: LIPASE, AMYLASE,  in the last 72 hours  Recent Labs  12/16/13 0340  WBC 15.3*  HGB 12.7*  HCT 37.8*  MCV 86.1  PLT 255   No results found for this basename: CKTOTAL, CKMB, TROPONINI,  in the last 72 hours No components found with this basename: POCBNP,  No results found for this basename: DDIMER,  in the last 72 hours No results found for this basename: HGBA1C,  in the last 72 hours No results found for this basename: CHOL, HDL, LDLCALC, TRIG, CHOLHDL,  in the last 72 hours No results found for this basename: TSH, T4TOTAL, FREET3, T3FREE, THYROIDAB,  in the last 72 hours No results found for this basename: VITAMINB12, FOLATE, FERRITIN, TIBC, IRON,  RETICCTPCT,  in the last 72 hours  Medications: Scheduled . amoxicillin-clavulanate  1 tablet Oral Q12H  . bisacodyl  10 mg Oral Daily  . enoxaparin (LOVENOX) injection  40 mg Subcutaneous Q24H  . guaiFENesin  600 mg Oral BID  . simvastatin  40 mg Oral q1800  . sodium chloride  10-40 mL Intracatheter Q12H     Radiology/Studies:  Dg Chest 2 View  12/18/2013   CLINICAL DATA:  Empyema drain, decortication  EXAM: CHEST - 2 VIEW  COMPARISON:  the previous day's study  FINDINGS: No pneumothorax. Stable right lateral and basilar pleural thickening or loculated effusion. Marland Kitchen Heart size upper limits normal. Stable subsegmental atelectasis or infiltrate at the left lung base. Coarse linear in airspace opacities in the mid and lower right lung stable. Visualized skeletal structures are unremarkable. Right subclavian central line to the mid SVC.  IMPRESSION: 1. Stable right effusion or pleural thickening and asymmetric bibasilar parenchymal opacities, right greater than left   Electronically Signed   By: Arne Cleveland M.D.   On: 12/18/2013 08:12   Dg Chest 2 View  12/17/2013   CLINICAL DATA:  Status post right thoracotomy and drainage of empyema.  EXAM: CHEST  2 VIEW  COMPARISON:  DG CHEST 1V PORT dated 12/16/2013  FINDINGS: Right-sided  subclavian line unchanged. Removal of right chest tube. Mild cardiomegaly. Small right pleural effusion is similar. No pneumothorax. Improved pulmonary venous congestion. Mild left base scarring or atelectasis. Slight improvement in right base airspace disease.  IMPRESSION: Removal of right-sided chest tube, without pneumothorax.  Similar right sided pleural effusion with improved adjacent atelectasis or infection.  Improved pulmonary venous congestion.   Electronically Signed   By: Abigail Miyamoto M.D.   On: 12/17/2013 09:02    INR: Will add last result for INR, ABG once components are confirmed Will add last 4 CBG results once components are confirmed  Assessment/Plan: S/P  Procedure(s) (LRB): VIDEO ASSISTED THORACOSCOPY (Right) DRAINAGE OF PLEURAL EFFUSION (Right) VIDEO BRONCHOSCOPY (Right) DECORTICATION 1 conts steady progress 2 stable from surgical viewpoint for discharge    LOS: 7 days    Derek Pennington E 3/2/201511:53 AM

## 2013-12-20 ENCOUNTER — Telehealth: Payer: Self-pay | Admitting: Internal Medicine

## 2013-12-20 NOTE — Telephone Encounter (Signed)
Pt scheduled with Dr. Leanne Chang 12/22/13 @ 10am, pt aware.

## 2013-12-20 NOTE — Telephone Encounter (Signed)
Pt was released from ICU on Monday, pt needs to see primary within a week, Can I schedule same day on Friday? Pt states if not would like for dr. Leanne Chang to recommend who else he can see next week.

## 2013-12-20 NOTE — Telephone Encounter (Signed)
Yes that's fine for Friday

## 2013-12-22 ENCOUNTER — Encounter: Payer: Self-pay | Admitting: Internal Medicine

## 2013-12-22 ENCOUNTER — Ambulatory Visit (INDEPENDENT_AMBULATORY_CARE_PROVIDER_SITE_OTHER)
Admission: RE | Admit: 2013-12-22 | Discharge: 2013-12-22 | Disposition: A | Discharge status: Home / Self Care | Payer: BC Managed Care – PPO | Source: Ambulatory Visit | Attending: Internal Medicine | Admitting: Internal Medicine

## 2013-12-22 ENCOUNTER — Ambulatory Visit (INDEPENDENT_AMBULATORY_CARE_PROVIDER_SITE_OTHER): Payer: BC Managed Care – PPO | Admitting: Internal Medicine

## 2013-12-22 VITALS — BP 134/94 | HR 104 | Temp 97.4°F | Ht 70.0 in | Wt 224.0 lb

## 2013-12-22 DIAGNOSIS — J189 Pneumonia, unspecified organism: Secondary | ICD-10-CM

## 2013-12-22 DIAGNOSIS — J45909 Unspecified asthma, uncomplicated: Secondary | ICD-10-CM

## 2013-12-22 MED ORDER — HYDROCODONE-HOMATROPINE 5-1.5 MG/5ML PO SYRP: 5.0000 mL | ORAL_SOLUTION | Freq: Three times a day (TID) | ORAL | Status: DC | PRN

## 2013-12-22 MED ORDER — OXYCODONE-ACETAMINOPHEN 5-325 MG PO TABS: 1.0000 | ORAL_TABLET | ORAL | Status: DC | PRN

## 2013-12-22 NOTE — Progress Notes (Signed)
Pre visit review using our clinic review tool, if applicable. No additional management support is needed unless otherwise documented below in the visit note. 

## 2013-12-22 NOTE — Progress Notes (Signed)
Post hospital f/u He is doing much better- Still has a cough-- causes a lot of pain- right sided. Cough occasionally productive  Still has intermittent sweating  Reviewed CXR-   Pain is much better but he still has some. He admits to feeling "worn out" and SOB with moderate exertion. His SOB is improving since leaving the hospital. Has some oozing from the chest tube site.   Past Medical History  Diagnosis Date  . Hyperlipidemia   . Testosterone deficiency   . ED (erectile dysfunction)   . Personal history of colonic adenomas 06/07/2013    2009 - diminutive polyp removed not recovered 05/2013 2 diminutive adenomas Repeat colon 2019 [name], MD, [facility]     History   Social History  . Marital Status: Married    Spouse Name: N/A    Number of Children: N/A  . Years of Education: N/A   Occupational History  . Not on file.   Social History Main Topics  . Smoking status: Former Research scientist (life sciences)  . Smokeless tobacco: Never Used  . Alcohol Use: 2.4 oz/week    4 Glasses of wine per week  . Drug Use: No  . Sexual Activity: Not on file   Other Topics Concern  . Not on file   Social History Narrative  . No narrative on file    Past Surgical History  Procedure Laterality Date  . Finger fracture surgery      fifth  . Lipoma back  1983  . Video assisted thoracoscopy Right 12/13/2013    Procedure: VIDEO ASSISTED THORACOSCOPY;  Surgeon: Grace Isaac, MD;  Location: [place];  Service: Thoracic;  Laterality: Right;  . Pleural effusion drainage Right 12/13/2013    Procedure: DRAINAGE OF PLEURAL EFFUSION;  Surgeon: Grace Isaac, MD;  Location: [place];  Service: Thoracic;  Laterality: Right;  . Video bronchoscopy Right 12/13/2013    Procedure: VIDEO BRONCHOSCOPY;  Surgeon: Grace Isaac, MD;  Location: [place];  Service: Thoracic;  Laterality: Right;  . Decortication  12/13/2013    Procedure: DECORTICATION;  Surgeon: Grace Isaac, MD;  Location: [facility] OR;  Service: Thoracic;;     Family History  Problem Relation Age of Onset  . Heart disease Mother     stents  . Leukemia Mother   . Heart attack Father   . Aneurysm Father     AAA  . Colon cancer Neg Hx     No Known Allergies  Current Outpatient Prescriptions on File Prior to Visit  Medication Sig Dispense Refill  . amoxicillin-clavulanate (AUGMENTIN) 875-125 MG per tablet Take 1 tablet by mouth every 12 (twelve) hours.  16 tablet  0  . bisacodyl (DULCOLAX) 5 MG EC tablet Take 2 tablets (10 mg total) by mouth daily as needed for moderate constipation.  30 tablet  0  . guaiFENesin (MUCINEX) 600 MG 12 hr tablet Take 1 tablet (600 mg total) by mouth 2 (two) times daily.      . nitroGLYCERIN (NITROSTAT) 0.4 MG SL tablet Place 0.4 mg under the tongue every 5 (five) minutes as needed for chest pain.       . simvastatin (ZOCOR) 40 MG tablet Take 1 tablet (40 mg total) by mouth at bedtime.  90 tablet  2  . Testosterone 30 MG/ACT SOLN Place 1 application onto the skin daily.  90 mL  5   No current facility-administered medications on file prior to visit.     patient denies chest pain, shortness of  breath, orthopnea. Denies lower extremity edema, abdominal pain, change in appetite, change in bowel movements. Patient denies rashes, musculoskeletal complaints. No other specific complaints in a complete review of systems.   BP 134/94  Pulse 104  Temp(Src) 97.4 F (36.3 C) (Oral)  Ht 5\' 10"  (1.778 m)  Wt 224 lb (101.606 kg)  BMI 32.14 kg/m2  SpO2 97% pulse on my check = 86  well-developed well-nourished male in no acute distress. HEENT exam atraumatic, normocephalic, neck supple without jugular venous distention. Chest clear with decreased BS t right base. Has some serous fluid from the chest tube site. No surrounding erythema no purulence. CV exam S1-S2 are regular. Abdominal exam overweight with bowel sounds, soft and nontender. Extremities no edema. Neurologic exam is alert with a normal gait.   CAP (community  acquired pneumonia) Pneumonia and right empyema have been treated with iv and oral ABX as well as chest tube drainage. He clearly is better but not yet well. I'm concerned with his history of "sweating". He has taken his temperature and it has always been normal. I'm going to get CXR to make sure that empyema is not worsening. He has f/u with [facility] next week.  He will continue abx.

## 2013-12-23 ENCOUNTER — Encounter: Payer: Self-pay | Admitting: Internal Medicine

## 2013-12-23 NOTE — Assessment & Plan Note (Signed)
Pneumonia and right empyema have been treated with iv and oral ABX as well as chest tube drainage. He clearly is better but not yet well. I'm concerned with his history of "sweating". He has taken his temperature and it has always been normal. I'm going to get CXR to make sure that empyema is not worsening. He has f/u with [facility] next week.  He will continue abx.

## 2013-12-25 ENCOUNTER — Other Ambulatory Visit: Payer: Self-pay | Admitting: *Deleted

## 2013-12-25 DIAGNOSIS — J869 Pyothorax without fistula: Secondary | ICD-10-CM

## 2013-12-28 ENCOUNTER — Other Ambulatory Visit: Payer: Self-pay | Admitting: Cardiothoracic Surgery

## 2013-12-28 ENCOUNTER — Ambulatory Visit (INDEPENDENT_AMBULATORY_CARE_PROVIDER_SITE_OTHER): Payer: BC Managed Care – PPO | Admitting: Cardiothoracic Surgery

## 2013-12-28 ENCOUNTER — Ambulatory Visit
Admission: RE | Admit: 2013-12-28 | Discharge: 2013-12-28 | Disposition: A | Discharge status: Home / Self Care | Payer: BC Managed Care – PPO | Source: Ambulatory Visit | Attending: Cardiothoracic Surgery | Admitting: Cardiothoracic Surgery

## 2013-12-28 ENCOUNTER — Encounter: Payer: Self-pay | Admitting: Cardiothoracic Surgery

## 2013-12-28 VITALS — BP 113/78 | HR 88 | Temp 98.7°F | Resp 16 | Ht 70.5 in | Wt 225.0 lb

## 2013-12-28 DIAGNOSIS — J869 Pyothorax without fistula: Secondary | ICD-10-CM

## 2013-12-28 DIAGNOSIS — Z09 Encounter for follow-up examination after completed treatment for conditions other than malignant neoplasm: Secondary | ICD-10-CM

## 2013-12-28 MED ORDER — AMOXICILLIN-POT CLAVULANATE 875-125 MG PO TABS: 1.0000 | ORAL_TABLET | Freq: Two times a day (BID) | ORAL | Status: DC

## 2013-12-28 NOTE — Patient Instructions (Signed)
Wet to dry dressing change twice a day as instructed in the office Continue antibiotics

## 2013-12-28 NOTE — Progress Notes (Signed)
[address]       [place] [address]             [phone]      [facility]ur[facility] Medical Record #[id number] Date of Birth: 03-31-1956  Referring: Lisabeth Pick, MD Primary Care: Chancy Hurter, MD  Chief Complaint:   POST OP FOLLOW UP 12/13/2013  PREOPERATIVE DIAGNOSIS: Right empyema.  POSTOPERATIVE DIAGNOSIS: Right empyema.  SURGICAL PROCEDURE: Bronchoscopy with right video-assisted  thoracoscopy, mini thoracotomy, drainage of empyema, and decortication.  SURGEON: Lanelle Bal, MD  History of Present Illness:     Patient returns to the office today after recent drainage of right empyema, with cultures positive for strep. The patient is continued to note intermittent chills but without fevers and sweats. The posterior most chest tube site has been draining .     Past Medical History  Diagnosis Date  . Hyperlipidemia   . Testosterone deficiency   . ED (erectile dysfunction)   . Personal history of colonic adenomas 06/07/2013    2009 - diminutive polyp removed not recovered 05/2013 2 diminutive adenomas Repeat colon 2019 [name], MD, [facility]   . Acute respiratory failure with hypoxia 12/11/2013     History  Smoking status  . Former Smoker  Smokeless tobacco  . Never Used    History  Alcohol Use  . 2.4 oz/week  . 4 Glasses of wine per week     No Known Allergies  Current Outpatient Prescriptions  Medication Sig Dispense Refill  . HYDROcodone-homatropine (HYCODAN) 5-1.5 MG/5ML syrup Take 5 mLs by mouth every 8 (eight) hours as needed for cough.  120 mL  0  . oxyCODONE-acetaminophen (PERCOCET/ROXICET) 5-325 MG per tablet Take 1-2 tablets by mouth every 4 (four) hours as needed for severe pain.  20 tablet  0  . simvastatin (ZOCOR) 40 MG tablet Take 1 tablet (40 mg total) by mouth at bedtime.  90 tablet  2  . amoxicillin-clavulanate (AUGMENTIN) 875-125 MG per tablet Take 1 tablet by mouth 2 (two) times daily.  20 tablet   0   No current facility-administered medications for this visit.       Physical Exam: BP 113/78  Pulse 88  Temp(Src) 98.7 F (37.1 C) (Oral)  Resp 16  Ht 5' 10.5" (1.791 m)  Wt 225 lb (102.059 kg)  BMI 31.82 kg/m2  SpO2 97%  General appearance: alert and cooperative Neurologic: intact Heart: regular rate and rhythm, S1, S2 normal, no murmur, click, rub or gallop Lungs: diminished breath sounds RLL Abdomen: soft, non-tender; bowel sounds normal; no masses,  no organomegaly Extremities: extremities normal, atraumatic, no cyanosis or edema and Homans sign is negative, no sign of DVT Wound:  The posterior most chest tube site is draining the chest tube suture was removed the l wound was cleaned with peroxide and Q-tip hits approximately 1 inch deep cultures were obtained the other port sites and incision are all healing well without evidence of infection   Diagnostic Studies & Laboratory data:     Recent Radiology Findings:   Dg Chest 2 View  12/28/2013   CLINICAL DATA:  History of right-sided empyema, recent pneumonia with cough and right chest pain  EXAM: CHEST  2 VIEW  COMPARISON:  Chest x-ray of 12/22/2013 and CT angio chest of 12/11/2013  FINDINGS: There has been very slight improvement in aeration of the right lung base. Pleural and parenchymal opacity remains at the right lung base laterally. The  left lung is clear. Mediastinal contours are stable and the heart is mildly enlarged and stable.  IMPRESSION: Minimally improved aeration. Persistent pleural and parenchymal opacity laterally at the right lung base.   Electronically Signed   By: Ivar Drape M.D.   On: 12/28/2013 10:15      Recent Lab Findings: Lab Results  Component Value Date   WBC 15.3* 12/16/2013   HGB 12.7* 12/16/2013   HCT 37.8* 12/16/2013   PLT 255 12/16/2013   GLUCOSE 116* 12/16/2013   CHOL 158 08/12/2012   TRIG 134.0 08/12/2012   HDL 40.50 08/12/2012   LDLDIRECT 198.4 10/16/2009   LDLCALC 91 08/12/2012     ALT 20 12/15/2013   AST 21 12/15/2013   NA 137 12/16/2013   K 3.9 12/16/2013   CL 95* 12/16/2013   CREATININE 1.02 12/16/2013   BUN 10 12/16/2013   CO2 31 12/16/2013   TSH 1.66 08/12/2012   INR 1.06 12/12/2013      Assessment / Plan:     Patient progressing after drainage of strep empyema. He was instructed in local wound care and packing of the chest tube site which is now open. We will continue his Augmentin for another 10 days. He will return next week with a followup chest x-ray and to recheck the wound. He is aware to call if he has increasing drainage or fevers/chills.    Grace Isaac MD      [address] [place] [address] Office [phone]   Beeper [phone]  12/28/2013 10:54 AM

## 2013-12-29 ENCOUNTER — Other Ambulatory Visit: Payer: Self-pay | Admitting: *Deleted

## 2013-12-29 DIAGNOSIS — G8918 Other acute postprocedural pain: Secondary | ICD-10-CM

## 2013-12-29 MED ORDER — OXYCODONE-ACETAMINOPHEN 5-325 MG PO TABS: 1.0000 | ORAL_TABLET | ORAL | Status: DC | PRN

## 2013-12-31 LAB — WOUND CULTURE
Gram Stain: NONE SEEN
Gram Stain: NONE SEEN
Organism ID, Bacteria: NO GROWTH

## 2014-01-01 ENCOUNTER — Other Ambulatory Visit: Payer: Self-pay | Admitting: *Deleted

## 2014-01-01 DIAGNOSIS — J869 Pyothorax without fistula: Secondary | ICD-10-CM

## 2014-01-04 ENCOUNTER — Ambulatory Visit
Admission: RE | Admit: 2014-01-04 | Discharge: 2014-01-04 | Disposition: A | Discharge status: Home / Self Care | Payer: BC Managed Care – PPO | Source: Ambulatory Visit | Attending: Cardiothoracic Surgery | Admitting: Cardiothoracic Surgery

## 2014-01-04 ENCOUNTER — Ambulatory Visit (INDEPENDENT_AMBULATORY_CARE_PROVIDER_SITE_OTHER): Payer: BC Managed Care – PPO | Admitting: Cardiothoracic Surgery

## 2014-01-04 ENCOUNTER — Ambulatory Visit: Payer: BC Managed Care – PPO | Admitting: Cardiothoracic Surgery

## 2014-01-04 ENCOUNTER — Encounter: Payer: Self-pay | Admitting: Cardiothoracic Surgery

## 2014-01-04 VITALS — BP 128/82 | HR 94 | Temp 98.7°F | Resp 16 | Ht 70.5 in | Wt 228.0 lb

## 2014-01-04 DIAGNOSIS — J869 Pyothorax without fistula: Secondary | ICD-10-CM

## 2014-01-04 DIAGNOSIS — Z09 Encounter for follow-up examination after completed treatment for conditions other than malignant neoplasm: Secondary | ICD-10-CM

## 2014-01-04 DIAGNOSIS — T8131XA Disruption of external operation (surgical) wound, not elsewhere classified, initial encounter: Secondary | ICD-10-CM

## 2014-01-04 NOTE — Progress Notes (Signed)
[address]       [place] [address]             [phone]         [place] Medical Record #[id number] Date of Birth: 12/27/1955  Referring: Rigoberto Noel, MD Primary Care: Chancy Hurter, MD  Chief Complaint:   POST OP FOLLOW UP 12/13/2013  PREOPERATIVE DIAGNOSIS: Right empyema.  POSTOPERATIVE DIAGNOSIS: Right empyema.  SURGICAL PROCEDURE: Bronchoscopy with right video-assisted  thoracoscopy, mini thoracotomy, drainage of empyema, and decortication.  SURGEON: Lanelle Bal, MD  History of Present Illness:     Patient returns to the office today after recent drainage of right empyema, with cultures positive for strep. When seen last week he had drainage from the chest tube site, this area was opened cultured the Gram stain and cultures were negative. The patient's continue with local wound care. He continues to have fatigue but no fever chills.    Past Medical History  Diagnosis Date  . Hyperlipidemia   . Testosterone deficiency   . ED (erectile dysfunction)   . Personal history of colonic adenomas 06/07/2013    2009 - diminutive polyp removed not recovered 05/2013 2 diminutive adenomas Repeat colon 2019 [name], MD, [facility]   . Acute respiratory failure with hypoxia 12/11/2013     History  Smoking status  . Former Smoker  Smokeless tobacco  . Never Used    History  Alcohol Use  . 2.4 oz/week  . 4 Glasses of wine per week     No Known Allergies  Current Outpatient Prescriptions  Medication Sig Dispense Refill  . amoxicillin-clavulanate (AUGMENTIN) 875-125 MG per tablet Take 1 tablet by mouth 2 (two) times daily.  20 tablet  0  . HYDROcodone-homatropine (HYCODAN) 5-1.5 MG/5ML syrup Take 5 mLs by mouth every 8 (eight) hours as needed for cough.  120 mL  0  . oxyCODONE-acetaminophen (PERCOCET/ROXICET) 5-325 MG per tablet Take 1-2 tablets by mouth every 4 (four) hours as needed for severe pain.  30 tablet   0  . simvastatin (ZOCOR) 40 MG tablet Take 1 tablet (40 mg total) by mouth at bedtime.  90 tablet  2   No current facility-administered medications for this visit.       Physical Exam: There were no vitals taken for this visit.  General appearance: alert and cooperative Neurologic: intact Heart: regular rate and rhythm, S1, S2 normal, no murmur, click, rub or gallop Lungs: diminished breath sounds RLL Abdomen: soft, non-tender; bowel sounds normal; no masses,  no organomegaly Extremities: extremities normal, atraumatic, no cyanosis or edema and Homans sign is negative, no sign of DVT Wound:  The posterior most chest tube site is no longer draining, the patient continues with local care and packing the site.  Diagnostic Studies & Laboratory data:     Recent Radiology Findings:   Dg Chest 2 View  01/04/2014   CLINICAL DATA:  Right empyema.  EXAM: CHEST  2 VIEW  COMPARISON:  12/28/2013  FINDINGS: Small right pleural effusion or thickening and associated pulmonary parenchymal scarring remains stable in appearance. Left lung remains clear. Heart size is normal.  IMPRESSION: Stable small right pleural effusion or thickening, and associated parenchymal scarring.   Electronically Signed   By: Earle Gell M.D.   On: 01/04/2014 12:38      Recent Lab Findings: Lab Results  Component Value Date   WBC 15.3* 12/16/2013  HGB 12.7* 12/16/2013   HCT 37.8* 12/16/2013   PLT 255 12/16/2013   GLUCOSE 116* 12/16/2013   CHOL 158 08/12/2012   TRIG 134.0 08/12/2012   HDL 40.50 08/12/2012   LDLDIRECT 198.4 10/16/2009   LDLCALC 91 08/12/2012   ALT 20 12/15/2013   AST 21 12/15/2013   NA 137 12/16/2013   K 3.9 12/16/2013   CL 95* 12/16/2013   CREATININE 1.02 12/16/2013   BUN 10 12/16/2013   CO2 31 12/16/2013   TSH 1.66 08/12/2012   INR 1.06 12/12/2013      Assessment / Plan:    Patient progressing after drainage of strep empyema. We will continue his Augmentin until his current prescription is  completed in several more days. He will return next week with a followup chest x-ray and to recheck the wound. He is aware to call if he has increasing drainage or fevers/chills. The patient is requesting pneumococcal vaccination, we will referring back to primary care for this after his current antibiotic therapy and infection has resolved.  Grace Isaac MD    [address] [place] [address] Office [phone]   Beeper [phone]  01/04/2014 1:03 PM

## 2014-01-05 ENCOUNTER — Other Ambulatory Visit: Payer: Self-pay | Admitting: Internal Medicine

## 2014-01-05 DIAGNOSIS — L600 Ingrowing nail: Secondary | ICD-10-CM

## 2014-01-09 ENCOUNTER — Other Ambulatory Visit: Payer: Self-pay | Admitting: *Deleted

## 2014-01-09 DIAGNOSIS — J869 Pyothorax without fistula: Secondary | ICD-10-CM

## 2014-01-10 LAB — FUNGUS CULTURE W SMEAR: Fungal Smear: NONE SEEN

## 2014-01-11 ENCOUNTER — Encounter: Payer: Self-pay | Admitting: Cardiothoracic Surgery

## 2014-01-11 ENCOUNTER — Ambulatory Visit
Admission: RE | Admit: 2014-01-11 | Discharge: 2014-01-11 | Disposition: A | Discharge status: Home / Self Care | Payer: BC Managed Care – PPO | Source: Ambulatory Visit | Attending: Cardiothoracic Surgery | Admitting: Cardiothoracic Surgery

## 2014-01-11 ENCOUNTER — Ambulatory Visit (INDEPENDENT_AMBULATORY_CARE_PROVIDER_SITE_OTHER): Payer: BC Managed Care – PPO | Admitting: Cardiothoracic Surgery

## 2014-01-11 VITALS — BP 114/80 | HR 84 | Resp 16 | Ht 70.5 in | Wt 232.0 lb

## 2014-01-11 DIAGNOSIS — T8131XA Disruption of external operation (surgical) wound, not elsewhere classified, initial encounter: Secondary | ICD-10-CM

## 2014-01-11 DIAGNOSIS — J869 Pyothorax without fistula: Secondary | ICD-10-CM

## 2014-01-11 DIAGNOSIS — Z09 Encounter for follow-up examination after completed treatment for conditions other than malignant neoplasm: Secondary | ICD-10-CM

## 2014-01-11 NOTE — Progress Notes (Signed)
[address]       [place] [address]             [phone]         De[facility] [place] Medical Record #[id number] Date of Birth: 1956-09-17  Referring: Rigoberto Noel, MD Primary Care: Chancy Hurter, MD  Chief Complaint:   POST OP FOLLOW UP 12/13/2013  PREOPERATIVE DIAGNOSIS: Right empyema.  POSTOPERATIVE DIAGNOSIS: Right empyema.  SURGICAL PROCEDURE: Bronchoscopy with right video-assisted  thoracoscopy, mini thoracotomy, drainage of empyema, and decortication.  SURGEON: Lanelle Bal, MD  History of Present Illness:     Patient returns to the office today after recent drainage of right empyema, with cultures positive for strep.  The patient's continue with local wound care. He continues to have fatigue but no fever chills. He notes he is improving and plans to start new job next week.    Past Medical History  Diagnosis Date  . Hyperlipidemia   . Testosterone deficiency   . ED (erectile dysfunction)   . Personal history of colonic adenomas 06/07/2013    2009 - diminutive polyp removed not recovered 05/2013 2 diminutive adenomas Repeat colon 2019 [name], MD, [facility]   . Acute respiratory failure with hypoxia 12/11/2013     History  Smoking status  . Former Smoker  Smokeless tobacco  . Never Used    History  Alcohol Use  . 2.4 oz/week  . 4 Glasses of wine per week     No Known Allergies  Current Outpatient Prescriptions  Medication Sig Dispense Refill  . oxyCODONE-acetaminophen (PERCOCET/ROXICET) 5-325 MG per tablet Take 1-2 tablets by mouth every 4 (four) hours as needed for severe pain.  30 tablet  0  . simvastatin (ZOCOR) 40 MG tablet Take 1 tablet (40 mg total) by mouth at bedtime.  90 tablet  2   No current facility-administered medications for this visit.       Physical Exam: BP 114/80  Pulse 84  Resp 16  Ht 5' 10.5" (1.791 m)  Wt 232 lb (105.235 kg)  BMI 32.81 kg/m2  SpO2 98%  General  appearance: alert and cooperative Neurologic: intact Heart: regular rate and rhythm, S1, S2 normal, no murmur, click, rub or gallop Lungs: diminished breath sounds RLL Abdomen: soft, non-tender; bowel sounds normal; no masses,  no organomegaly Extremities: extremities normal, atraumatic, no cyanosis or edema and Homans sign is negative, no sign of DVT Wound:  The posterior most chest tube site is no longer draining, the patient continues with local care and packing the site. Site is decreasing in size  Diagnostic Studies & Laboratory data:     Recent Radiology Findings:   Dg Chest 2 View  01/11/2014   CLINICAL DATA:  Empyema, shortness of breath  EXAM: CHEST  2 VIEW  COMPARISON:  Prior chest x-ray 01/04/2014  FINDINGS: Persistent and likely partially loculated right pleural effusion with associated right basilar atelectasis versus consolidation. No significant interval change in the appearance of the right lung base compared to 01/04/2014. The left lung remains clear. Cardiac mediastinal contours are unchanged. No pneumothorax. No acute osseous abnormality.  IMPRESSION: Persistent and unchanged partially loculated right pleural effusion with associated right basilar atelectasis and/or consolidation.   Electronically Signed   By: Jacqulynn Cadet M.D.   On: 01/11/2014 09:01      Recent Lab Findings: Lab Results  Component Value Date   WBC 15.3* 12/16/2013   HGB  12.7* 12/16/2013   HCT 37.8* 12/16/2013   PLT 255 12/16/2013   GLUCOSE 116* 12/16/2013   CHOL 158 08/12/2012   TRIG 134.0 08/12/2012   HDL 40.50 08/12/2012   LDLDIRECT 198.4 10/16/2009   LDLCALC 91 08/12/2012   ALT 20 12/15/2013   AST 21 12/15/2013   NA 137 12/16/2013   K 3.9 12/16/2013   CL 95* 12/16/2013   CREATININE 1.02 12/16/2013   BUN 10 12/16/2013   CO2 31 12/16/2013   TSH 1.66 08/12/2012   INR 1.06 12/12/2013      Assessment / Plan:    Patient progressing after drainage of strep empyema. He has completed  his  Augmentin. Improving with some persistent pleural reaction right lower lobe. Will see back in 3 weeks with chest xray    The patient is requesting pneumococcal vaccination, we will referring back to primary care for this after his current antibiotic therapy and infection has resolved.  Grace Isaac MD    [address] [place] [address] Office [phone]   Beeper [phone]  01/11/2014 10:19 AM

## 2014-01-26 LAB — AFB CULTURE WITH SMEAR (NOT AT ARMC): Acid Fast Smear: NONE SEEN

## 2014-02-01 ENCOUNTER — Ambulatory Visit: Payer: BC Managed Care – PPO | Admitting: Cardiothoracic Surgery

## 2014-02-05 ENCOUNTER — Other Ambulatory Visit: Payer: Self-pay | Admitting: Cardiothoracic Surgery

## 2014-02-05 DIAGNOSIS — J869 Pyothorax without fistula: Secondary | ICD-10-CM

## 2014-02-08 ENCOUNTER — Ambulatory Visit
Admission: RE | Admit: 2014-02-08 | Discharge: 2014-02-08 | Disposition: A | Discharge status: Home / Self Care | Payer: BC Managed Care – PPO | Source: Ambulatory Visit | Attending: Cardiothoracic Surgery | Admitting: Cardiothoracic Surgery

## 2014-02-08 ENCOUNTER — Ambulatory Visit (INDEPENDENT_AMBULATORY_CARE_PROVIDER_SITE_OTHER): Payer: BC Managed Care – PPO | Admitting: Cardiothoracic Surgery

## 2014-02-08 ENCOUNTER — Encounter: Payer: Self-pay | Admitting: Cardiothoracic Surgery

## 2014-02-08 VITALS — BP 126/89 | HR 76 | Resp 16 | Ht 70.5 in | Wt 230.0 lb

## 2014-02-08 DIAGNOSIS — J869 Pyothorax without fistula: Secondary | ICD-10-CM

## 2014-02-08 DIAGNOSIS — Z09 Encounter for follow-up examination after completed treatment for conditions other than malignant neoplasm: Secondary | ICD-10-CM

## 2014-02-08 DIAGNOSIS — T8131XA Disruption of external operation (surgical) wound, not elsewhere classified, initial encounter: Secondary | ICD-10-CM

## 2014-02-08 NOTE — Progress Notes (Signed)
[place] Record #[id number] Date of Birth: 02-23-1956  Referring: Rigoberto Noel, MD Primary Care: Chancy Hurter, MD  Chief Complaint:   POST OP FOLLOW UP 12/13/2013  PREOPERATIVE DIAGNOSIS: Right empyema.  POSTOPERATIVE DIAGNOSIS: Right empyema.  SURGICAL PROCEDURE: Bronchoscopy with right video-assisted  thoracoscopy, mini thoracotomy, drainage of empyema, and decortication.  SURGEON: Lanelle Bal, MD  History of Present Illness:     Patient returns to the office today after recent drainage of right empyema, with cultures positive for strep.  Patient has now return to work full time. He plans a backpacking trip this weekend. Notes that overall his respiratory status continues to improve we does have some mild shortness of breath with heavy exertion.   Past Medical History  Diagnosis Date  . Hyperlipidemia   . Testosterone deficiency   . ED (erectile dysfunction)   . Personal history of colonic adenomas 06/07/2013    2009 - diminutive polyp removed not recovered 05/2013 2 diminutive adenomas Repeat colon 2019 [name], MD, [facility]   . Acute respiratory failure with hypoxia 12/11/2013     History  Smoking status  . Former Smoker  Smokeless tobacco  . Never Used    History  Alcohol Use  . 2.4 oz/week  . 4 Glasses of wine per week     No Known Allergies  Current Outpatient Prescriptions  Medication Sig Dispense Refill  . simvastatin (ZOCOR) 40 MG tablet Take 1 tablet (40 mg total) by mouth at bedtime.  90 tablet  2   No current facility-administered medications for this visit.       Physical Exam: BP 126/89  Pulse 76  Resp 16  Ht 5' 10.5" (1.791 m)  Wt 230 lb (104.327 kg)  BMI 32.52 kg/m2  SpO2 98%  General appearance: alert and cooperative Neurologic: intact Heart: regular rate and rhythm, S1, S2 normal, no murmur, click, rub or gallop Lungs: diminished breath sounds RLL Abdomen: soft, non-tender; bowel  sounds normal; no masses,  no organomegaly Extremities: extremities normal, atraumatic, no cyanosis or edema and Homans sign is negative, no sign of DVT Wound:  The patient's incision and port sites are all completely healed without evidence of infection  Diagnostic Studies & Laboratory data:     Recent Radiology Findings:   Dg Chest 2 View  02/08/2014   CLINICAL DATA:  Right empyema, right lung surgery 12/13/2013  EXAM: CHEST  2 VIEW  COMPARISON:  01/11/2014, 01/04/2014, 12/28/2013  FINDINGS: There is a small loculated right pleural effusion with basilar atelectasis the left lung is clear. No pneumothorax. Stable cardiomediastinal silhouette. Unremarkable osseous structures.  IMPRESSION: Stable small right loculated pleural effusion and basilar atelectasis.   Electronically Signed   By: Kathreen Devoid   On: 02/08/2014 08:34      Recent Lab Findings: Lab Results  Component Value Date   WBC 15.3* 12/16/2013   HGB 12.7* 12/16/2013   HCT 37.8* 12/16/2013   PLT 255 12/16/2013   GLUCOSE 116* 12/16/2013   CHOL 158 08/12/2012   TRIG 134.0 08/12/2012   HDL 40.50 08/12/2012   LDLDIRECT 198.4 10/16/2009   LDLCALC 91 08/12/2012   ALT 20 12/15/2013   AST 21 12/15/2013   NA 137 12/16/2013   K 3.9 12/16/2013   CL 95* 12/16/2013   CREATININE 1.02 12/16/2013   BUN 10 12/16/2013   CO2 31 12/16/2013   TSH 1.66 08/12/2012   INR 1.06 12/12/2013  Assessment / Plan:    Patient progressing after drainage of strep empyema.  Improving with some persistent pleural reaction right lung field  Will see back in 3 months with chest xray   I recommended to the patient the contact primary care office to obtain pneumococcal vaccination if he has not had one  Grace Isaac MD    [address] [place] [address] Office [phone]   Beeper [phone]  02/08/2014 10:16 AM

## 2014-05-08 ENCOUNTER — Other Ambulatory Visit: Payer: Self-pay | Admitting: Cardiothoracic Surgery

## 2014-05-08 DIAGNOSIS — J869 Pyothorax without fistula: Secondary | ICD-10-CM

## 2014-05-10 ENCOUNTER — Ambulatory Visit
Admission: RE | Admit: 2014-05-10 | Discharge: 2014-05-10 | Disposition: A | Discharge status: Home / Self Care | Payer: BC Managed Care – PPO | Source: Ambulatory Visit | Attending: Cardiothoracic Surgery | Admitting: Cardiothoracic Surgery

## 2014-05-10 ENCOUNTER — Encounter: Payer: Self-pay | Admitting: Cardiothoracic Surgery

## 2014-05-10 ENCOUNTER — Ambulatory Visit (INDEPENDENT_AMBULATORY_CARE_PROVIDER_SITE_OTHER): Payer: BC Managed Care – PPO | Admitting: Cardiothoracic Surgery

## 2014-05-10 VITALS — BP 138/93 | HR 78 | Resp 16 | Ht 70.5 in | Wt 248.0 lb

## 2014-05-10 DIAGNOSIS — J869 Pyothorax without fistula: Secondary | ICD-10-CM

## 2014-05-10 DIAGNOSIS — Z09 Encounter for follow-up examination after completed treatment for conditions other than malignant neoplasm: Secondary | ICD-10-CM

## 2014-05-10 NOTE — Progress Notes (Signed)
Derek Pennington Date of Birth: January 30, 1956  Referring: Derek Noel, MD Primary Care: Derek Hurter, MD  Chief Complaint:   POST OP FOLLOW UP 12/13/2013  PREOPERATIVE DIAGNOSIS: Right empyema.  POSTOPERATIVE DIAGNOSIS: Right empyema.  SURGICAL PROCEDURE: Bronchoscopy with right video-assisted  thoracoscopy, mini thoracotomy, drainage of empyema, and decortication.  SURGEON: Derek Bal, MD  History of Present Illness:     Patient returns to the office today after recent drainage of right empyema, with cultures positive for strep.  Patient has returned to normal activities, including hiking and backpacking without difficulty   Past Medical History  Diagnosis Date  . Hyperlipidemia   . Testosterone deficiency   . ED (erectile dysfunction)   . Personal history of colonic adenomas 06/07/2013    2009 - diminutive polyp removed not recovered 05/2013 2 diminutive adenomas Repeat colon 2019 Derek Mayer, MD, [facility]   . Acute respiratory failure with hypoxia 12/11/2013     History  Smoking status  . Former Smoker  Smokeless tobacco  . Never Used    History  Alcohol Use  . 2.4 oz/week  . 4 Glasses of wine per week     No Known Allergies  Current Outpatient Prescriptions  Medication Sig Dispense Refill  . simvastatin (ZOCOR) 40 MG tablet Take 1 tablet (40 mg total) by mouth at bedtime.  90 tablet  2   No current facility-administered medications for this visit.       Physical Exam: BP 138/93  Pulse 78  Resp 16  Ht 5' 10.5" (1.791 m)  Wt 248 lb (112.492 kg)  BMI 35.07 kg/m2  SpO2 96%  General appearance: alert and cooperative Neurologic: intact Heart: regular rate and rhythm, S1, S2 normal, no murmur, click, rub or gallop Lungs: diminished breath sounds RLL Abdomen: soft, non-tender; bowel sounds normal; no masses,  no organomegaly Extremities: extremities normal, atraumatic, no cyanosis or edema and  Homans sign is negative, no sign of DVT Wound:  The patient's incision and port sites are all completely healed without evidence of infection  Diagnostic Studies & Laboratory data:     Recent Radiology Findings:   Dg Chest 2 View  05/10/2014   CLINICAL DATA:  History of right empyema and surgery.  EXAM: CHEST  2 VIEW  COMPARISON:  02/08/2014.  FINDINGS: Trachea is midline. Heart size stable. Loculated pleural fluid in the lower right hemi thorax has decreased slightly in size in the interval. Resolving subsegmental atelectasis at the right lung base is well. No left pleural fluid.  IMPRESSION: Interval decrease in size of loculated pleural fluid at the base of the right hemi thorax with associated improving right basilar aeration.   Electronically Signed   By: Derek Pennington M.D.   On: 05/10/2014 11:41      Recent Lab Findings: Lab Results  Component Value Date   WBC 15.3* 12/16/2013   HGB 12.7* 12/16/2013   HCT 37.8* 12/16/2013   PLT 255 12/16/2013   GLUCOSE 116* 12/16/2013   CHOL 158 08/12/2012   TRIG 134.0 08/12/2012   HDL 40.50 08/12/2012   LDLDIRECT 198.4 10/16/2009   LDLCALC 91 08/12/2012   ALT 20 12/15/2013   AST 21 12/15/2013   NA 137 12/16/2013   K 3.9 12/16/2013   CL 95* 12/16/2013   CREATININE 1.02 12/16/2013   BUN 10 12/16/2013   CO2 31 12/16/2013   TSH 1.66 08/12/2012   INR 1.06 12/12/2013  Assessment / Plan:   Improved overall appearance of his chest x-ray following drainage of empyema in February 2015. Again recommended to the patient to get a pneumococcal vaccination which she has made arrangements to do in the near future. I'll plan to see him back when necessary   Derek Isaac MD    [address] [place] [address] Office [phone]   Beeper [phone]  05/10/2014 12:23 PM

## 2014-07-30 ENCOUNTER — Encounter: Payer: Self-pay | Admitting: Internal Medicine

## 2015-02-19 ENCOUNTER — Ambulatory Visit (INDEPENDENT_AMBULATORY_CARE_PROVIDER_SITE_OTHER): Payer: BLUE CROSS/BLUE SHIELD | Admitting: Family Medicine

## 2015-02-19 ENCOUNTER — Encounter: Payer: Self-pay | Admitting: Family Medicine

## 2015-02-19 DIAGNOSIS — N401 Enlarged prostate with lower urinary tract symptoms: Secondary | ICD-10-CM | POA: Diagnosis not present

## 2015-02-19 DIAGNOSIS — J869 Pyothorax without fistula: Secondary | ICD-10-CM | POA: Diagnosis not present

## 2015-02-19 DIAGNOSIS — E291 Testicular hypofunction: Secondary | ICD-10-CM

## 2015-02-19 DIAGNOSIS — N138 Other obstructive and reflux uropathy: Secondary | ICD-10-CM

## 2015-02-19 DIAGNOSIS — E785 Hyperlipidemia, unspecified: Secondary | ICD-10-CM | POA: Diagnosis not present

## 2015-02-19 DIAGNOSIS — Z87891 Personal history of nicotine dependence: Secondary | ICD-10-CM | POA: Insufficient documentation

## 2015-02-19 LAB — COMPREHENSIVE METABOLIC PANEL
ALK PHOS: 68 U/L (ref 39–117)
ALT: 20 U/L (ref 0–53)
AST: 15 U/L (ref 0–37)
Albumin: 4.1 g/dL (ref 3.5–5.2)
BILIRUBIN TOTAL: 0.8 mg/dL (ref 0.2–1.2)
BUN: 21 mg/dL (ref 6–23)
CO2: 29 mEq/L (ref 19–32)
CREATININE: 1.15 mg/dL (ref 0.40–1.50)
Calcium: 9.9 mg/dL (ref 8.4–10.5)
Chloride: 101 mEq/L (ref 96–112)
GFR: 69.14 mL/min (ref >60.00)
Glucose, Bld: 97 mg/dL (ref 70–99)
Potassium: 5 mEq/L (ref 3.5–5.1)
SODIUM: 137 meq/L (ref 135–145)
TOTAL PROTEIN: 7.7 g/dL (ref 6.0–8.3)

## 2015-02-19 LAB — PSA: PSA: 3.11 ng/mL (ref 0.10–4.00)

## 2015-02-19 LAB — CBC
HCT: 45.7 % (ref 39.0–52.0)
HEMOGLOBIN: 16 g/dL (ref 13.0–17.0)
MCHC: 35.2 g/dL (ref 30.0–36.0)
MCV: 83.6 fl (ref 78.0–100.0)
PLATELETS: 221 10*3/uL (ref 150.0–400.0)
RBC: 5.46 Mil/uL (ref 4.22–5.81)
RDW: 13.3 % (ref 11.5–15.5)
WBC: 6.4 10*3/uL (ref 4.0–10.5)

## 2015-02-19 LAB — LIPID PANEL
CHOLESTEROL: 268 mg/dL — AB (ref 0–200)
HDL: 44.7 mg/dL (ref >39.00)
LDL CALC: 195 mg/dL — AB (ref 0–99)
NonHDL: 223.3
TRIGLYCERIDES: 140 mg/dL (ref 0.0–149.0)
Total CHOL/HDL Ratio: 6
VLDL: 28 mg/dL (ref 0.0–40.0)

## 2015-02-19 LAB — TSH: TSH: 1.98 u[IU]/mL (ref 0.35–4.50)

## 2015-02-19 NOTE — Patient Instructions (Addendum)
Please get [place] a copy of your pneumonia shot record for prevnar 13 and any other immunizations through work.   Great to meet you  Update fasting labs today  If you are going to be off of a statin, really need to make sure we are getting regular exercise, working on weight goal of 200-210, eating healthier- see tips below        My 5 to Fitness!  5: fruits and vegetables per day (work on 9 per day if you are at 5) 4: exercise 4-5 times per week for at least 30 minutes (walking counts!) goal 150 minutes a week 3: meals per day (don't skip breakfast!) 2: habits to quit -smoking -excess alcohol use (men >2 beer/day; women >1beer/day) 0-1: sweet per day (2 cookies, 1 small cup of ice cream, 12 oz soda)  These are general tips for healthy living. Try to start with 1 or 2 habit TODAY and make it a part of your life for several months.   Once you have 1 or 2 habits down for several months, try to begin working on your next healthy habit. With every single step you take, you will be leading a healthier lifestyle!

## 2015-02-19 NOTE — Assessment & Plan Note (Signed)
Check lipids today off statins and calculate 10 year risk. Patient states not likely to take statin even if risk elevated. I instructed him on need for healthier eating, regular exercise, weight loss to lower cardiac risk.

## 2015-02-19 NOTE — Assessment & Plan Note (Signed)
Declines therapy. Do need PSA for baseline before testosterone therapy though.

## 2015-02-19 NOTE — Progress Notes (Signed)
Derek Reddish, MD Phone: [phone]  Subjective:  Patient presents today to establish care with me as their new primary care provider. Patient was formerly a patient of Dr. Leanne Chang. Chief complaint-noted.   Hyperlipidemia-controlled on last check on statin, now off x 6 months  Worried about diabetes and relation to ED so stopped. Works in Doctor, hospital.  Lab Results  Component Value Date   LDLCALC 91 08/12/2012   On statin: no Regular exercise: hikes every other weekend several miles Diet: poor choices, knows overweight ROS- no chest pain or shortness of breath. No myalgias  Hypogonadism-suspect poor control -Symptoms: erectile dysfunction, mood Topical did not improve levels Rx injections were not used- can get injections through work now and did not want to inject himself Worried statins decreased his levels. ROS-denies decreased testicular size  BPH with urinary obstruction -primary symptom weak stream. No nocturia. Some urgency though not regular issue. Was told enlarged many years ago with first DRE.  ROS- denies unintentional weight loss, fatigue   The following were reviewed and entered/updated in epic: Past Medical History  Diagnosis Date  . Hyperlipidemia   . Testosterone deficiency   . ED (erectile dysfunction)   . Personal history of colonic adenomas 06/07/2013    2009 - diminutive polyp removed not recovered 05/2013 2 diminutive adenomas Repeat colon 2019 [name], MD, [facility]   . Acute respiratory failure with hypoxia 12/11/2013   Patient Active Problem List   Diagnosis Date Noted  . Hyperlipidemia 04/25/2007    Priority: Medium  . Former smoker 02/19/2015    Priority: Low  . Empyema of right pleural space 12/11/2013    Priority: Low  . Personal history of colonic adenomas 06/07/2013    Priority: Low  . Hypogonadism in male 01/20/2010    Priority: Low  . BPH with urinary obstruction 01/20/2010    Priority: Low   Past Surgical History  Procedure  Laterality Date  . Finger fracture surgery      fifth R  . Lipoma back  1983  . Video assisted thoracoscopy Right 12/13/2013    Procedure: VIDEO ASSISTED THORACOSCOPY;  Surgeon: Grace Isaac, MD;  Location: [place];  Service: Thoracic;  Laterality: Right;  . Pleural effusion drainage Right 12/13/2013    Procedure: DRAINAGE OF PLEURAL EFFUSION;  Surgeon: Grace Isaac, MD;  Location: [place];  Service: Thoracic;  Laterality: Right;  . Video bronchoscopy Right 12/13/2013    Procedure: VIDEO BRONCHOSCOPY;  Surgeon: Grace Isaac, MD;  Location: [place];  Service: Thoracic;  Laterality: Right;  . Decortication  12/13/2013    Procedure: DECORTICATION;  Surgeon: Grace Isaac, MD;  Location: [facility] OR;  Service: Thoracic;;    Family History  Problem Relation Age of Onset  . Heart disease Mother     stents in early 13s, nonsmoker  . Leukemia Mother     led to death  . Heart attack Father     age 81, smoker. stress from fungal lung infection  . Aneurysm Father     AAA on autopsy  . Colon cancer Neg Hx     Medications- reviewed and updated, no rx as stopped taking statin 6 months ago  Allergies-reviewed and updated No Known Allergies  History   Social History  . Marital Status: Married    Spouse Name: N/A  . Number of Children: N/A  . Years of Education: N/A   Social History Main Topics  . Smoking status: Former Smoker -- 0.50 packs/day for 10  years    Types: Cigarettes    Quit date: 10/19/1993  . Smokeless tobacco: Never Used  . Alcohol Use: 2.4 oz/week    4 Glasses of wine per week  . Drug Use: No  . Sexual Activity: Not on file   Other Topics Concern  . None   Social History Narrative   Married (wife patient of Dr. Yong Channel), 2 children (18 son going to [place] and 35 son in 2016), no grandkids.       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 Oropharynx normal. Cerumen obscures TM CV: RRR no murmurs rubs or gallops Lungs: CTAB no crackles, wheeze, rhonchi Abdomen: soft/nontender/nondistended/normal bowel sounds. No rebound or guarding.  Ext: no edema Skin: warm, dry, no rash Neuro: grossly normal, moves all extremities, PERRLA   Assessment/Plan:  Hyperlipidemia Check lipids today off statins and calculate 10 year risk. Patient states not likely to take statin even if risk elevated. I instructed him on need for healthier eating, regular exercise, weight loss to lower cardiac risk.    Hypogonadism in male Check testosterone levels. Consider injections if low. I reviewed through results review after visit and he had in fact had low free testosterone on prior checks. Check PSA before starting therapy and repeat along with doing a DRE in 3 months along with repeat levels testosterone.    BPH with urinary obstruction Declines therapy. Do need PSA for baseline before testosterone therapy though.    1 year if no testosterone or lipid therapy. 3 months if starts testosterone injections.   fasting Orders Placed This Encounter  Procedures  . CBC    [place]  . Comprehensive metabolic panel    [place]    Order Specific Question:  Has the patient fasted?    Answer:  No  . Lipid panel    [place]    Order Specific Question:  Has the patient fasted?    Answer:  No  . TSH    [place]  . Testosterone, Free, Total, SHBG  . PSA

## 2015-02-19 NOTE — Assessment & Plan Note (Signed)
Check testosterone levels. Consider injections if low. I reviewed through results review after visit and he had in fact had low free testosterone on prior checks. Check PSA before starting therapy and repeat along with doing a DRE in 3 months along with repeat levels testosterone.

## 2015-02-20 LAB — TESTOSTERONE, FREE, TOTAL, SHBG
Sex Hormone Binding: 26 nmol/L (ref 22–77)
TESTOSTERONE FREE: 80.6 pg/mL (ref 47.0–244.0)
TESTOSTERONE-% FREE: 2.3 % (ref 1.6–2.9)
Testosterone: 353 ng/dL (ref 300–890)

## 2015-02-21 ENCOUNTER — Encounter: Payer: Self-pay | Admitting: Family Medicine

## 2015-06-09 IMAGING — CR DG CHEST 2V
2 series · 2 of 2 positions shown · non-contrast
Comparison: None.

CLINICAL DATA: Fall.

EXAM:
CHEST  2 VIEW

[view not recorded (1 of 2)]
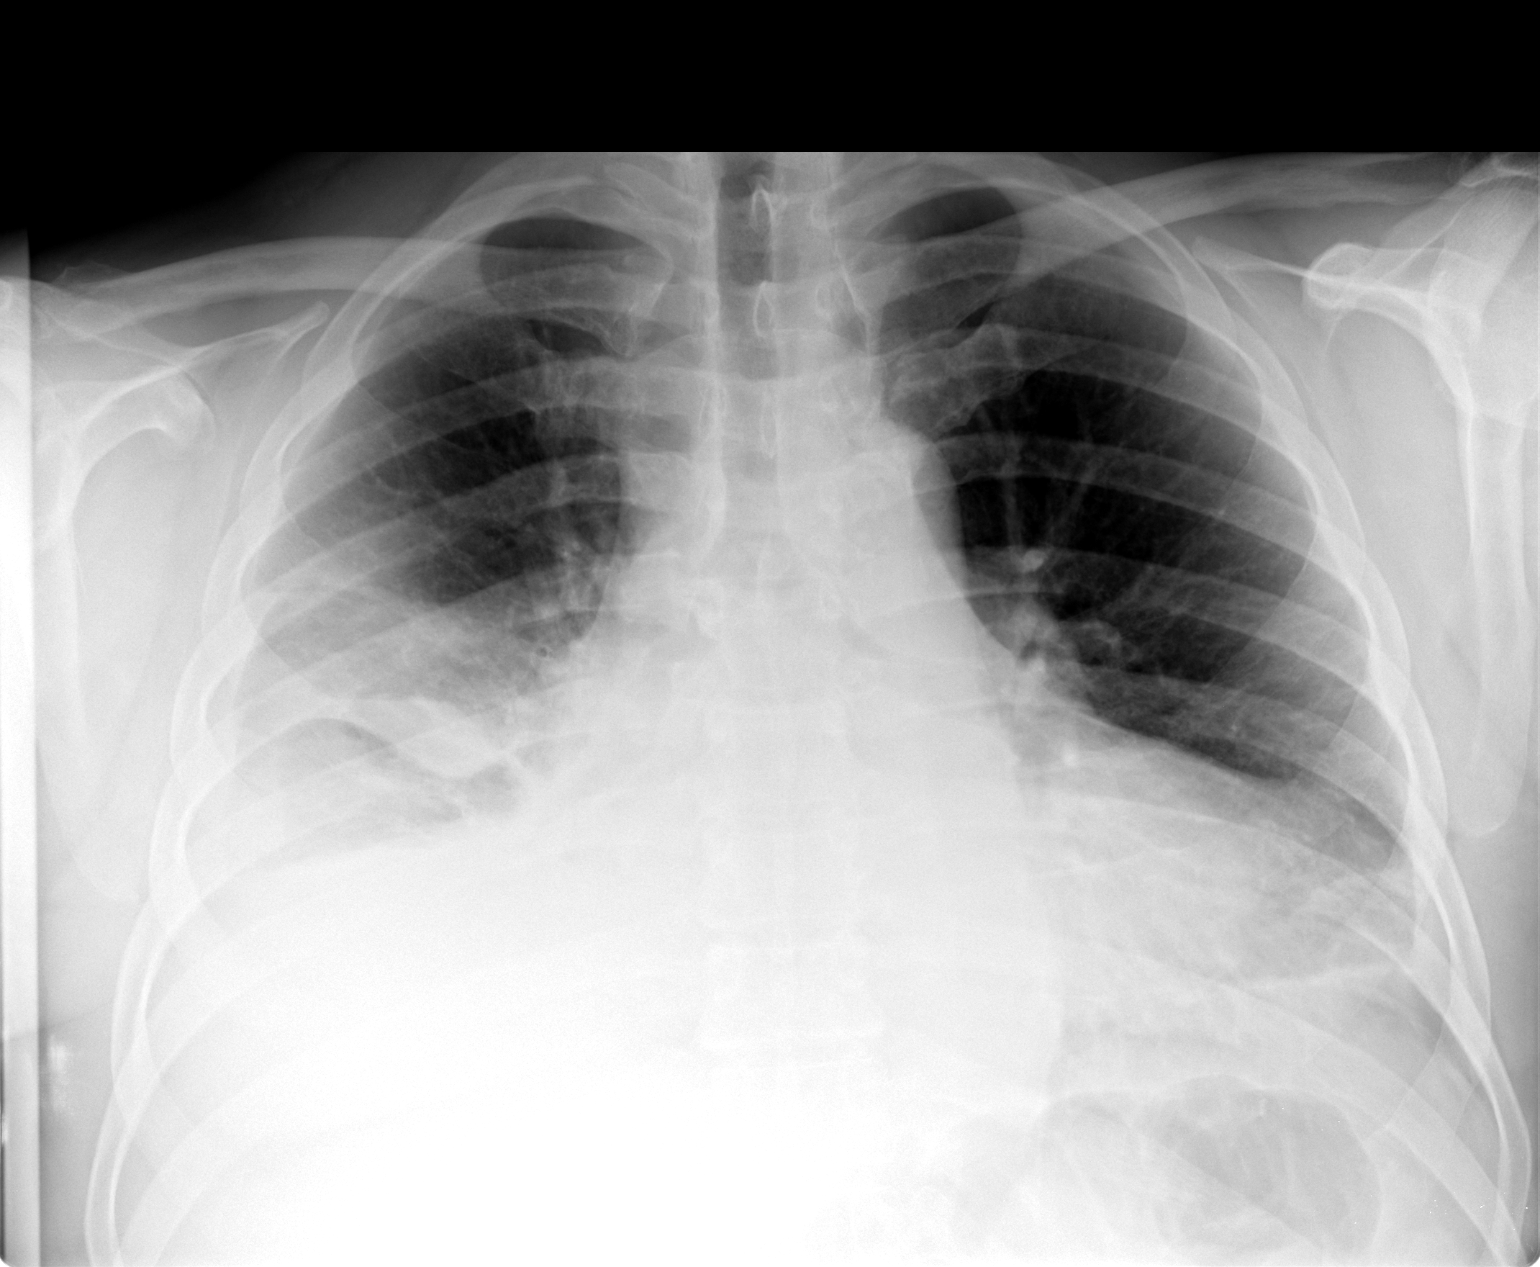

[view not recorded (2 of 2)]
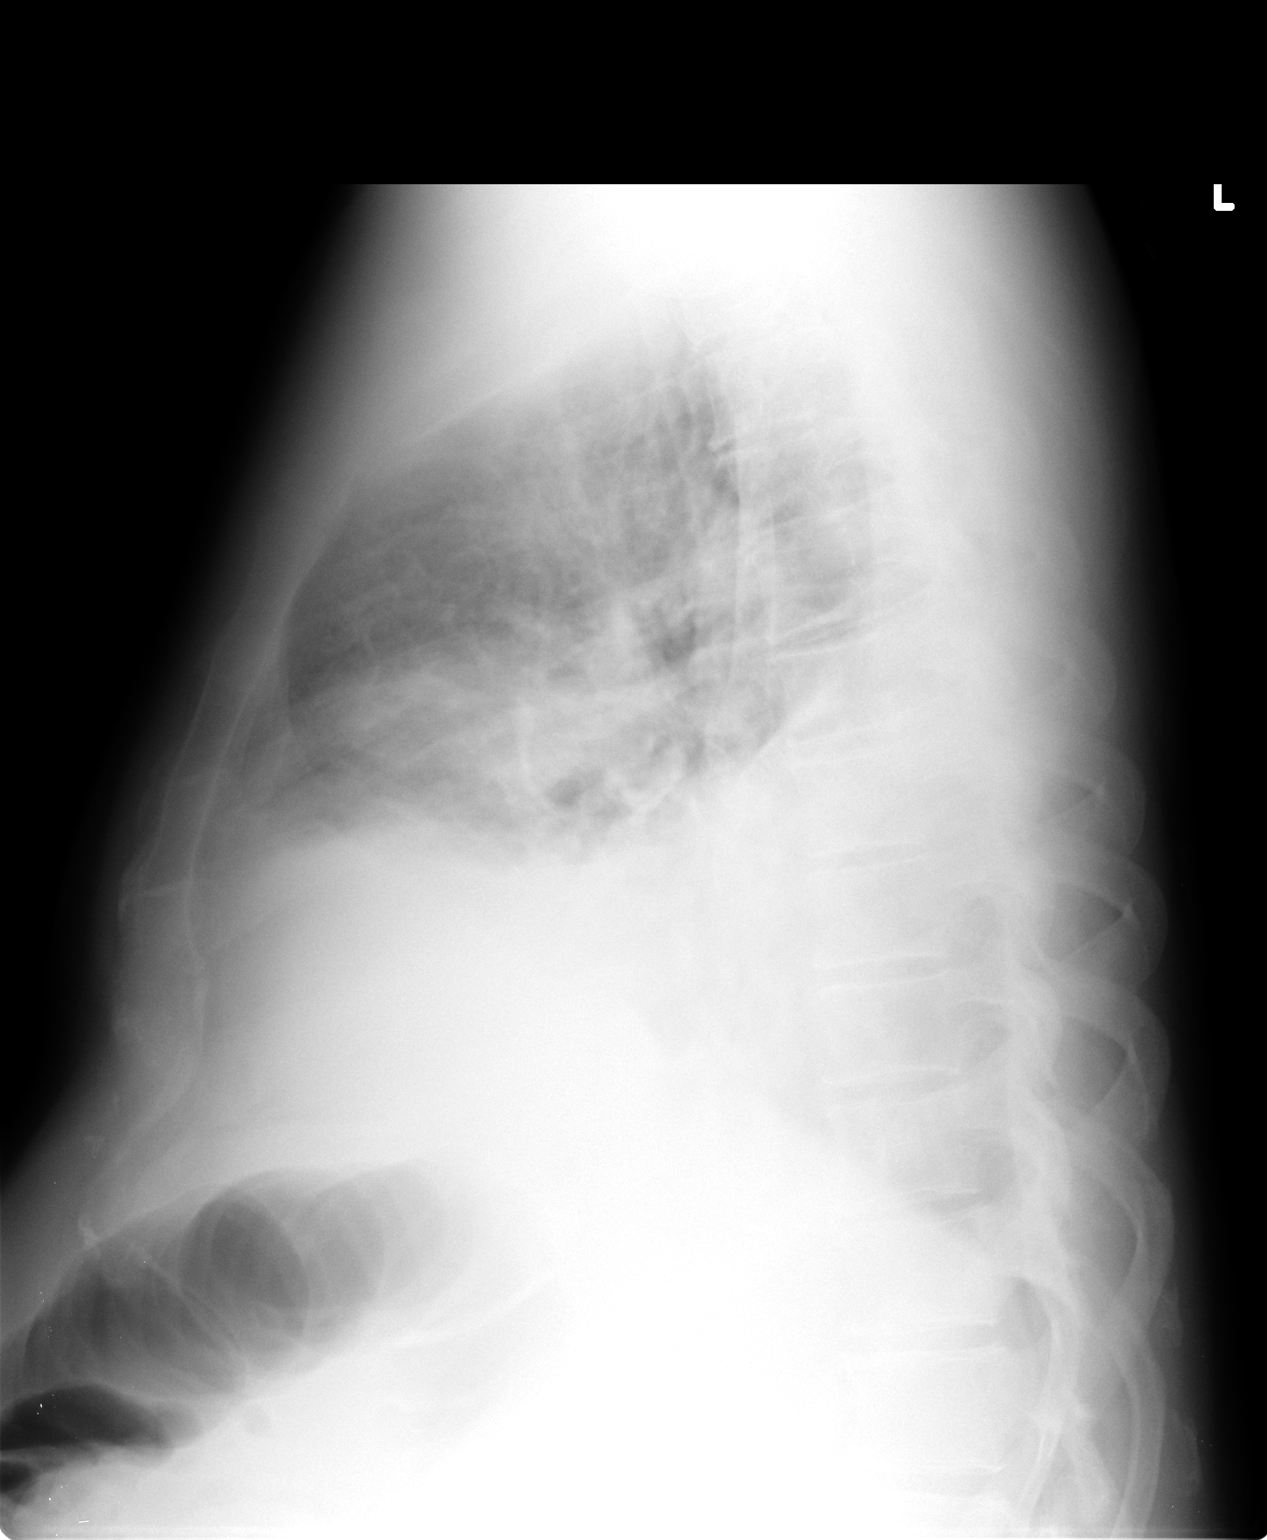

[2 of 2 positions shown; findings below may reference images not displayed]

FINDINGS: Right lower lobe infiltrate with right pleural effusion noted.
Pleural fluid noted in the minor fissure. Basilar atelectasis.
Possible infiltrate left lung base noted. Cardiomegaly. No pulmonary
venous congestion. No acute osseous abnormality.
IMPRESSION: 1. Right lower lobe infiltrate consistent with pneumonia. Associated
right pleural effusion is present with fluid in the minor fissure.
2. Left base atelectatic changes, mild infiltrate in the left lung
base cannot be excluded.
3. Cardiomegaly, no pulmonary venous distention.

## 2015-06-11 IMAGING — DX DG CHEST 1V PORT
1 series · 1 of 1 positions shown · non-contrast
Comparison: DG CHEST 1V PORT dated 12/12/2013;

CLINICAL DATA: Postop.

EXAM:
PORTABLE CHEST - 1 VIEW

[portable]
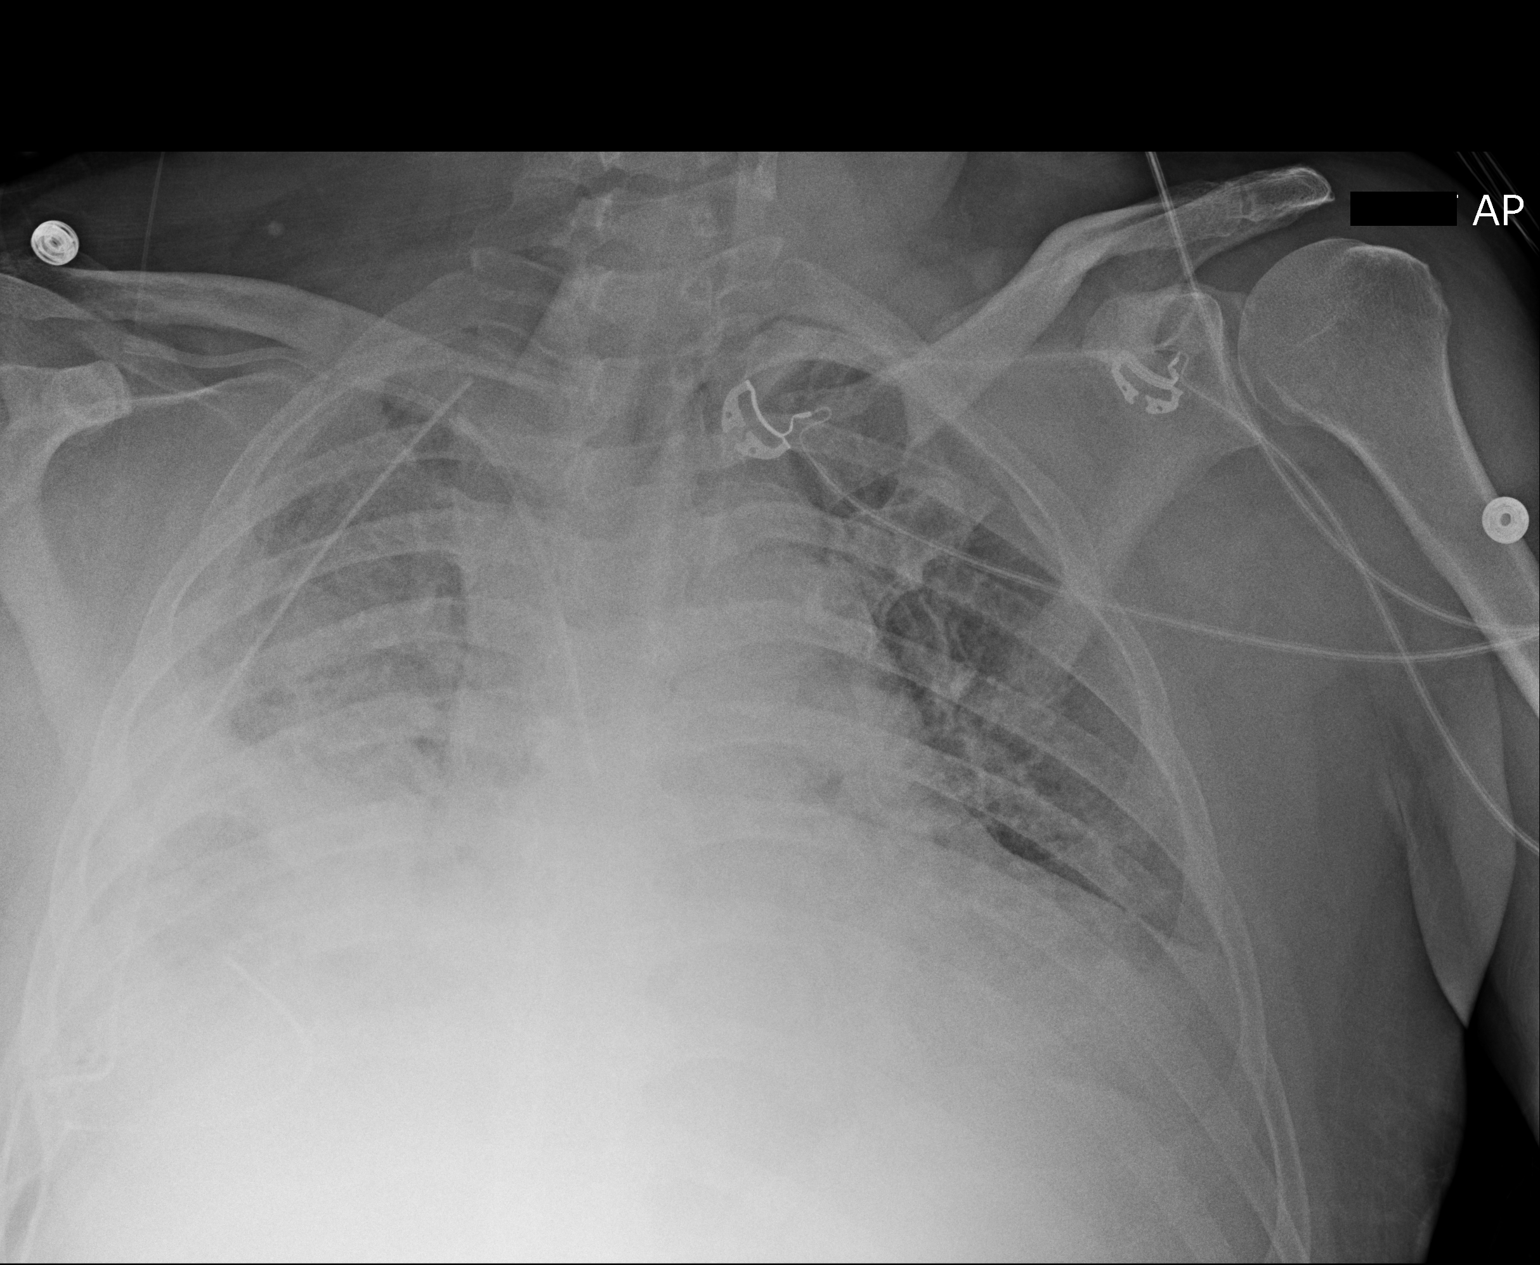

[1 of 1 positions shown; findings below may reference images not displayed]

CT ANGIO CHEST W/CM
&/OR WO/CM dated 12/11/2013; DG CHEST 1V PORT dated 12/11/2013; DG
CHEST 2 VIEW dated 12/11/2013
FINDINGS: Patient is rotated. Right subclavian central line tip projects over
the SVC. Two right chest tubes are in place with tips at the apex
and base of the right hemi thorax. No definite pneumothorax.
Extensive right lung airspace disease. Loculated right pleural
effusion, decreased in size. Small left pleural effusion.
IMPRESSION: 1. Interval placement of a right subclavian central line and 2 right
chest tubes without pneumothorax.
2. Decrease in size of a loculated right pleural effusion.
3. Extensive right lung airspace disease.

## 2015-06-12 IMAGING — CR DG CHEST 1V PORT
1 series · 1 of 1 positions shown · non-contrast
Comparison: DG CHEST 1V PORT dated 12/13/2013

CLINICAL DATA: Pleural effusion

EXAM:
PORTABLE CHEST - 1 VIEW

[AP]
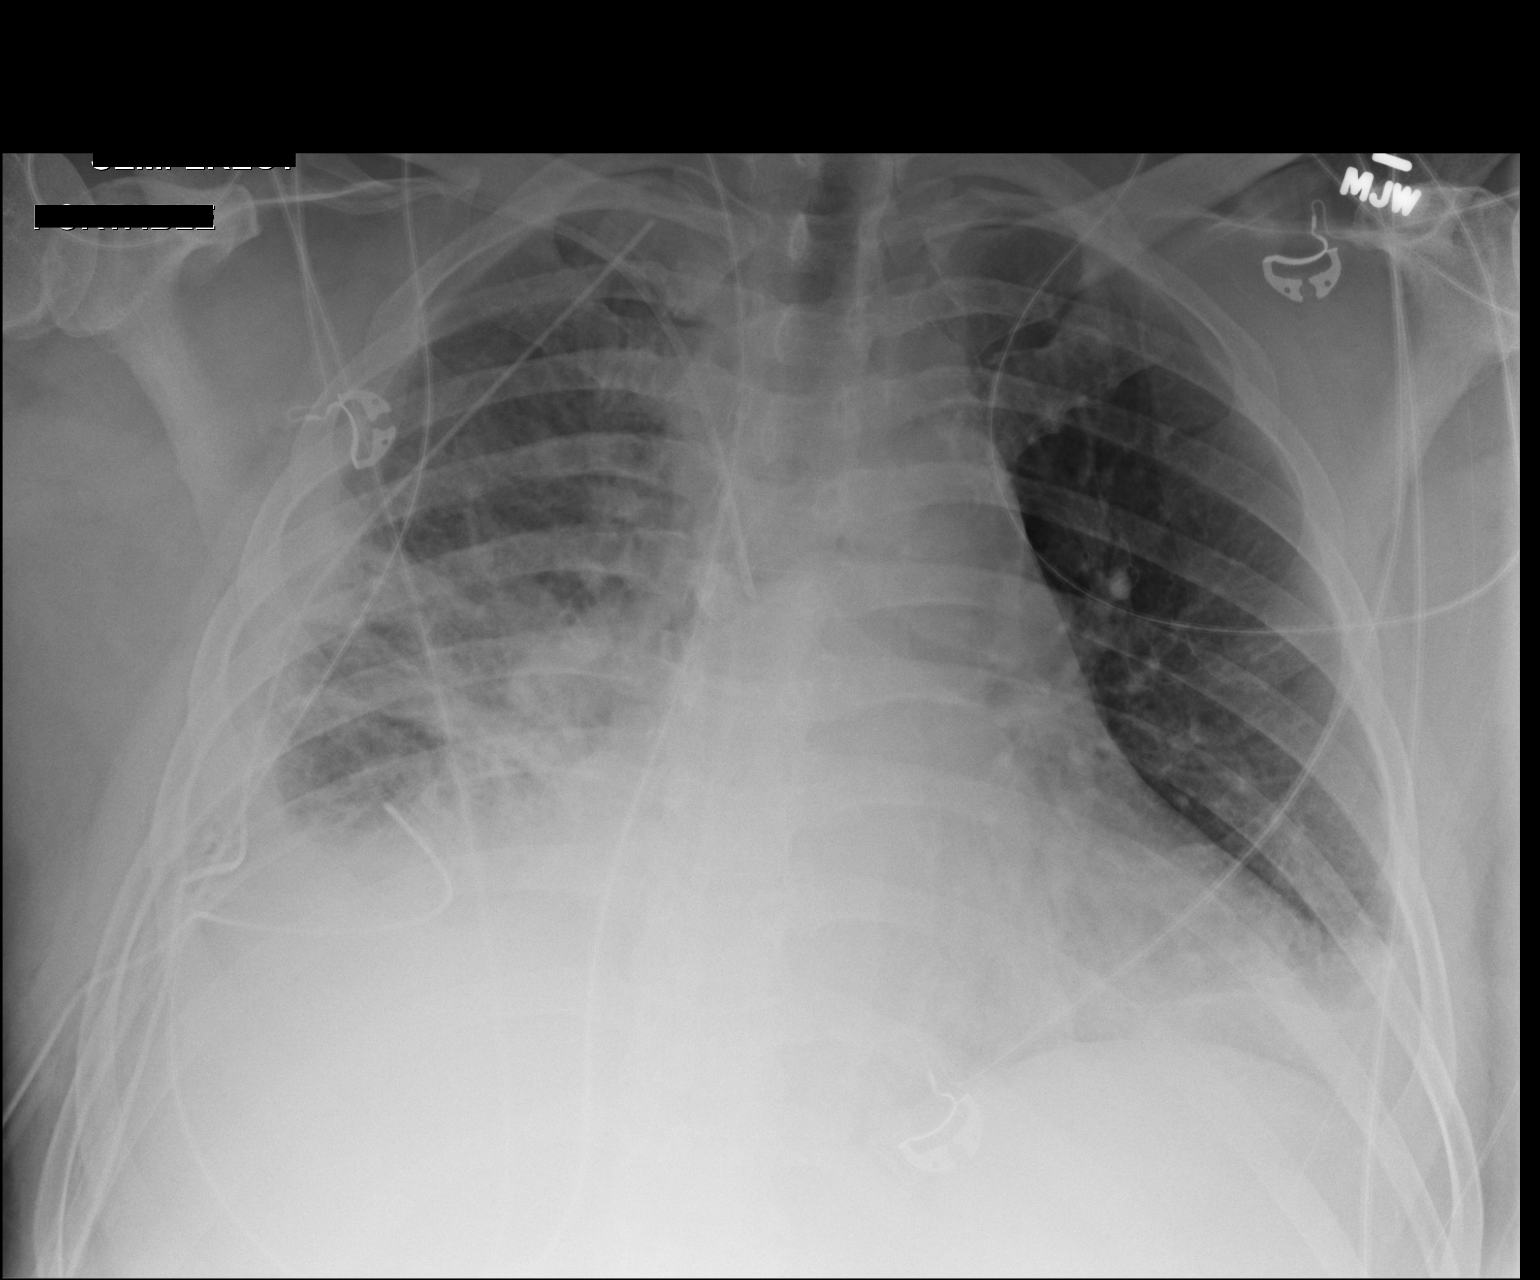

[1 of 1 positions shown; findings below may reference images not displayed]

FINDINGS: Right chest tubes and right subclavian central venous catheter
stable. Airspace disease throughout the right lung and left base are
improved. Pleural changes in the right lung are not significantly
changed. No pneumothorax. Cardiomegaly.
IMPRESSION: Improved bilateral airspace disease

Stable pleural changes in the right hemi thorax with chest tubes in
place. No pneumothorax.

## 2015-09-01 ENCOUNTER — Telehealth: Payer: Self-pay | Admitting: Internal Medicine

## 2015-09-01 DIAGNOSIS — K625 Hemorrhage of anus and rectum: Secondary | ICD-10-CM | POA: Insufficient documentation

## 2015-09-01 MED ORDER — HYDROCORTISONE ACETATE 25 MG RE SUPP: 25.0000 mg | Freq: Every day | RECTAL | Status: DC

## 2015-09-01 NOTE — Telephone Encounter (Signed)
Experienced seepage of bright red blood over past 2 d after NL stool Slight anal discomfort. NL stool again today. Less bleeding Suspect hemorrhoidal bleeding - will treat as such w/HC suppositories and will arrange f/u

## 2015-09-06 NOTE — Telephone Encounter (Signed)
Communication with patient 2 d ago and no further bleeding

## 2016-02-04 DIAGNOSIS — H6123 Impacted cerumen, bilateral: Secondary | ICD-10-CM | POA: Diagnosis not present

## 2016-12-22 ENCOUNTER — Other Ambulatory Visit: Payer: BLUE CROSS/BLUE SHIELD

## 2016-12-27 LAB — PSA: PSA: 4.43

## 2016-12-28 ENCOUNTER — Other Ambulatory Visit: Payer: BLUE CROSS/BLUE SHIELD

## 2016-12-29 ENCOUNTER — Other Ambulatory Visit (INDEPENDENT_AMBULATORY_CARE_PROVIDER_SITE_OTHER): Payer: BLUE CROSS/BLUE SHIELD

## 2016-12-29 ENCOUNTER — Encounter: Payer: BLUE CROSS/BLUE SHIELD | Admitting: Family Medicine

## 2016-12-29 DIAGNOSIS — E291 Testicular hypofunction: Secondary | ICD-10-CM

## 2016-12-29 DIAGNOSIS — R7989 Other specified abnormal findings of blood chemistry: Secondary | ICD-10-CM

## 2016-12-29 DIAGNOSIS — Z0000 Encounter for general adult medical examination without abnormal findings: Secondary | ICD-10-CM | POA: Diagnosis not present

## 2016-12-29 LAB — CBC WITH DIFFERENTIAL/PLATELET
Basophils Absolute: 0.1 10*3/uL (ref 0.0–0.1)
Basophils Relative: 1.1 % (ref 0.0–3.0)
EOS PCT: 4.6 % (ref 0.0–5.0)
Eosinophils Absolute: 0.3 10*3/uL (ref 0.0–0.7)
HEMATOCRIT: 46.3 % (ref 39.0–52.0)
Hemoglobin: 16.2 g/dL (ref 13.0–17.0)
LYMPHS ABS: 2.2 10*3/uL (ref 0.7–4.0)
LYMPHS PCT: 28.8 % (ref 12.0–46.0)
MCHC: 35 g/dL (ref 30.0–36.0)
MCV: 84 fl (ref 78.0–100.0)
MONOS PCT: 7.6 % (ref 3.0–12.0)
Monocytes Absolute: 0.6 10*3/uL (ref 0.1–1.0)
Neutro Abs: 4.4 10*3/uL (ref 1.4–7.7)
Neutrophils Relative %: 57.9 % (ref 43.0–77.0)
Platelets: 221 10*3/uL (ref 150.0–400.0)
RBC: 5.51 Mil/uL (ref 4.22–5.81)
RDW: 13.3 % (ref 11.5–15.5)
WBC: 7.5 10*3/uL (ref 4.0–10.5)

## 2016-12-29 LAB — POC URINALSYSI DIPSTICK (AUTOMATED)
Bilirubin, UA: NEGATIVE
Glucose, UA: NEGATIVE
Ketones, UA: NEGATIVE
LEUKOCYTES UA: NEGATIVE
NITRITE UA: NEGATIVE
PH UA: 5.5
PROTEIN UA: NEGATIVE
RBC UA: NEGATIVE
Spec Grav, UA: 1.025
UROBILINOGEN UA: 0.2

## 2016-12-29 LAB — TSH: TSH: 2.13 u[IU]/mL (ref 0.35–4.50)

## 2016-12-29 LAB — PSA: PSA: 4.43 ng/mL — AB (ref 0.10–4.00)

## 2016-12-29 LAB — BASIC METABOLIC PANEL
BUN: 19 mg/dL (ref 6–23)
CALCIUM: 9.7 mg/dL (ref 8.4–10.5)
CO2: 26 meq/L (ref 19–32)
Chloride: 101 mEq/L (ref 96–112)
Creatinine, Ser: 1.1 mg/dL (ref 0.40–1.50)
GFR: 72.32 mL/min (ref >60.00)
GLUCOSE: 90 mg/dL (ref 70–99)
POTASSIUM: 4.2 meq/L (ref 3.5–5.1)
SODIUM: 137 meq/L (ref 135–145)

## 2016-12-29 LAB — LIPID PANEL
CHOLESTEROL: 271 mg/dL — AB (ref 0–200)
HDL: 46 mg/dL (ref >39.00)
NonHDL: 224.87
Total CHOL/HDL Ratio: 6
Triglycerides: 289 mg/dL — ABNORMAL HIGH (ref 0.0–149.0)
VLDL: 57.8 mg/dL — ABNORMAL HIGH (ref 0.0–40.0)

## 2016-12-29 LAB — HEPATIC FUNCTION PANEL
ALBUMIN: 4.2 g/dL (ref 3.5–5.2)
ALK PHOS: 59 U/L (ref 39–117)
ALT: 28 U/L (ref 0–53)
AST: 20 U/L (ref 0–37)
Bilirubin, Direct: 0.1 mg/dL (ref 0.0–0.3)
TOTAL PROTEIN: 7.5 g/dL (ref 6.0–8.3)
Total Bilirubin: 0.9 mg/dL (ref 0.2–1.2)

## 2016-12-29 LAB — TESTOSTERONE: TESTOSTERONE: 253.98 ng/dL — AB (ref 300.00–890.00)

## 2016-12-29 LAB — LDL CHOLESTEROL, DIRECT: Direct LDL: 164 mg/dL

## 2016-12-30 ENCOUNTER — Encounter: Payer: BLUE CROSS/BLUE SHIELD | Admitting: Family Medicine

## 2017-01-03 DIAGNOSIS — M7662 Achilles tendinitis, left leg: Secondary | ICD-10-CM | POA: Diagnosis not present

## 2017-01-05 ENCOUNTER — Encounter: Payer: Self-pay | Admitting: Family Medicine

## 2017-01-05 ENCOUNTER — Ambulatory Visit (INDEPENDENT_AMBULATORY_CARE_PROVIDER_SITE_OTHER): Payer: BLUE CROSS/BLUE SHIELD | Admitting: Family Medicine

## 2017-01-05 VITALS — BP 134/82 | HR 93 | Temp 98.6°F | Ht 70.5 in | Wt 252.8 lb

## 2017-01-05 DIAGNOSIS — N138 Other obstructive and reflux uropathy: Secondary | ICD-10-CM | POA: Diagnosis not present

## 2017-01-05 DIAGNOSIS — Z8249 Family history of ischemic heart disease and other diseases of the circulatory system: Secondary | ICD-10-CM

## 2017-01-05 DIAGNOSIS — R972 Elevated prostate specific antigen [PSA]: Secondary | ICD-10-CM | POA: Diagnosis not present

## 2017-01-05 DIAGNOSIS — E785 Hyperlipidemia, unspecified: Secondary | ICD-10-CM

## 2017-01-05 DIAGNOSIS — N401 Enlarged prostate with lower urinary tract symptoms: Secondary | ICD-10-CM

## 2017-01-05 DIAGNOSIS — Z0000 Encounter for general adult medical examination without abnormal findings: Secondary | ICD-10-CM | POA: Diagnosis not present

## 2017-01-05 NOTE — Progress Notes (Signed)
Pre visit review using our clinic review tool, if applicable. No additional management support is needed unless otherwise documented below in the visit note. 

## 2017-01-05 NOTE — Assessment & Plan Note (Signed)
Elevated PSA hypogonadism S: he feels that his testosterone is better off statin and is one of many reasons he wants to remain off. PSA has trended up at brisker velocity than desired Lab Results  Component Value Date   PSA 4.43 (H) 12/29/2016   PSA 3.11 02/19/2015   PSA 2.33 08/12/2012  A/P: discussed this may be BPH alone but with concern of PSA trend- would refer to urology for discussion about potential biopsy. I also discussed would nto want to treat testosterone deficiency given some question about possible prostate cancer- he does not seem very concerned about current level of testosterone Lab Results  Component Value Date   TESTOSTERONE 253.98 (L) 12/29/2016

## 2017-01-05 NOTE — Patient Instructions (Addendum)
We will call you within a week or two about your referral to urology and cardiology. If you do not hear within 3 weeks, give [place] a call.   Weight loss is key for you. I know you know what to do- now time to do it! Exercise 150 minutes a week advised, water only to drink, 3-5 servings of veggies a day are good starting points.   Could consider an aspirin a day to reduce cardiac risk since not wanting to use statin. I am concerned with your triglycerides- see handout

## 2017-01-05 NOTE — Progress Notes (Signed)
Phone: [phone]  Subjective:  Patient presents today for their annual physical. Chief complaint-noted.   See problem oriented charting- ROS- full  review of systems was completed and negative except for: weak urinary stream, nocturia  The following were reviewed and entered/updated in epic: Past Medical History:  Diagnosis Date  . Acute respiratory failure with hypoxia ([place]) 12/11/2013  . ED (erectile dysfunction)   . Hyperlipidemia   . Personal history of colonic adenomas 06/07/2013   2009 - diminutive polyp removed not recovered 05/2013 2 diminutive adenomas Repeat colon 2019 [name], MD, [facility]   . Testosterone deficiency    Patient Active Problem List   Diagnosis Date Noted  . BPH with urinary obstruction 01/20/2010    Priority: Medium  . Hyperlipidemia 04/25/2007    Priority: Medium  . Former smoker 02/19/2015    Priority: Low  . Empyema of right pleural space ([place]) 12/11/2013    Priority: Low  . Personal history of colonic adenomas 06/07/2013    Priority: Low  . Hypogonadism in male 01/20/2010    Priority: Low  . Rectal bleeding 09/01/2015   Past Surgical History:  Procedure Laterality Date  . DECORTICATION  12/13/2013   Procedure: DECORTICATION;  Surgeon: Grace Isaac, MD;  Location: [place];  Service: Thoracic;;  . FINGER FRACTURE SURGERY     fifth R  . lipoma back  1983  . PLEURAL EFFUSION DRAINAGE Right 12/13/2013   Procedure: DRAINAGE OF PLEURAL EFFUSION;  Surgeon: Grace Isaac, MD;  Location: [place];  Service: Thoracic;  Laterality: Right;  Marland Kitchen VIDEO ASSISTED THORACOSCOPY Right 12/13/2013   Procedure: VIDEO ASSISTED THORACOSCOPY;  Surgeon: Grace Isaac, MD;  Location: [place];  Service: Thoracic;  Laterality: Right;  Marland Kitchen VIDEO BRONCHOSCOPY Right 12/13/2013   Procedure: VIDEO BRONCHOSCOPY;  Surgeon: Grace Isaac, MD;  Location: [facility] OR;  Service: Thoracic;  Laterality: Right;    Family History  Problem Relation Age of Onset  . Heart disease  Mother     stents in early 37s, nonsmoker  . Leukemia Mother     led to death  . Heart attack Father     age 36, smoker. stress from fungal lung infection  . Aneurysm Father     AAA on autopsy  . Colon cancer Neg Hx     Medications- reviewed and updated No current outpatient prescriptions on file.   No current facility-administered medications for this visit.     Allergies-reviewed and updated No Known Allergies  Social History   Social History  . Marital status: Married    Spouse name: N/A  . Number of children: N/A  . Years of education: N/A   Social History Main Topics  . Smoking status: Former Research scientist (life sciences)  . Smokeless tobacco: Never Used  . Alcohol use 2.4 oz/week    4 Glasses of wine per week  . Drug use: No  . Sexual activity: Not Asked   Other Topics Concern  . None   Social History Narrative   Married (wife patient of Dr. Yong Channel), 2 children (18 son going to [place] and 72 son in 2016), no grandkids.       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 Oropharynx normal Neck: no thyromegaly CV: RRR no murmurs rubs or gallops Lungs: CTAB no crackles, wheeze, rhonchi Abdomen: soft/nontender/nondistended/normal bowel sounds. No rebound or guarding.  Ext: no edema Skin: warm, dry. He has several dark lesions on his skin- one on left upper back and one on right shoulder in particular but he states they are stable/not changing- he monitors front and wife monitors the  back. Several seborheic keratosis as well Neuro: grossly normal, moves all extremities, PERRLA  Rectal declined- wants to see dermatology  Assessment/Plan:  61 y.o. male presenting for annual physical.  Health Maintenance counseling: 1. Anticipatory guidance: Patient counseled regarding regular dental exams q6 months, eye exams yearly, wearing seatbelts.  2. Risk factor reduction:  Advised patient of need for regular exercise and diet rich and fruits and vegetables to reduce risk of heart attack and stroke. Exercise- down this winter. Diet-weight up, some stress eating for a meeting with FDA.  Weight creeping up- advised to reverse trend Wt Readings from Last 3 Encounters:  01/05/17 252 lb 12.8 oz (114.7 kg)  02/19/15 246 lb (111.6 kg)  05/10/14 248 lb (112.5 kg)  3. Immunizations/screenings/ancillary studies- declines influenza  Immunization History  Administered Date(s) Administered  . PPD Test 08/12/2012  . Pneumococcal Polysaccharide-23 05/29/2014  . Td 10/19/2000, 10/18/2012  . Tdap 08/12/2012   Health Maintenance Due  Topic Date Due  . Hepatitis C Screening - next labs 07-31-1956   4. Prostate cancer screening-  bph with nocturia. Stream has really weakened. See discussion below about urology referral Lab Results  Component Value Date   PSA 4.43 (H) 12/29/2016   PSA 3.11 02/19/2015   PSA 2.33 08/12/2012   5. Colon cancer screening - repeat 2019. 2014 had 2 adenoams 6. Skin cancer screening- does not see dermatology. We did a waist up exam today- discussed I am not a dermatologist but with no changing/growing lesions I did not suggest any current biopsies- most lesions appear benign  Status of chronic or acute concerns   Pain in area of right chest after VATS surgery seemed to finally calm down about 2 years ago. States has pretty much resolved except if hard hike.   With temps under 30 degrees gets pain with inhalation in his "bronchi" mid chest. Resolves with warmer  temperatures or if covers mouth/puts scarf on. Lungs clear- decided to monitor.   BPH with urinary obstruction Elevated PSA hypogonadism S: he feels that his testosterone is better off statin and is one of many reasons he wants to remain off. PSA has trended up at brisker velocity than desired Lab Results  Component Value Date   PSA 4.43 (H) 12/29/2016   PSA 3.11 02/19/2015   PSA 2.33 08/12/2012  A/P: discussed this may be BPH alone but with concern of PSA trend- would refer to urology for discussion about potential biopsy. I also discussed would nto want to treat testosterone deficiency given some question about possible prostate cancer- he does not seem very concerned about current level of testosterone Lab Results  Component Value Date   TESTOSTERONE 253.98 (L) 12/29/2016    Hyperlipidemia Family history CAD S: poorly controlled on no statin. he is not impressed by  data on primary prevention so declines statin (works in Adult nurse). Also worried about effects on testosterone. On the other hand he is very worried about his cardiac risk. Had stress test in 2012 which was low risk. Mother with MI in young 31s.  Lab Results  Component Value Date   CHOL 271 (H) 12/29/2016   HDL 46.00 12/29/2016   LDLCALC 195 (H) 02/19/2015   LDLDIRECT 164.0 12/29/2016   TRIG 289.0 (H) 12/29/2016   CHOLHDL 6 12/29/2016   A/P: patient asks about updating stress testing. Discussed with him option of POET- with his VATS since last stress test- not sure if that could affect the stress test. Also he does not particularly want to use dyes/contrast. I discussed with him possible cardiology referral to decide on testing and he agrees to this. My other concern which I discussed with him is since he is asymptomatic- would this be covered by insurance and likely cardiology has more experience with this question.    Return in about 1 year (around 01/05/2018) for physical.  Orders Placed This Encounter    Procedures  . Ambulatory referral to Cardiology    Referral Priority:   Routine    Referral Type:   Consultation    Referral Reason:   Specialty Services Required    Requested Specialty:   Cardiology    Number of Visits Requested:   1  . Ambulatory referral to Urology    Referral Priority:   Routine    Referral Type:   Consultation    Referral Reason:   Specialty Services Required    Requested Specialty:   Urology    Number of Visits Requested:   1   We spent approximately 25 minutes on preventative care. We then spent another 15-20 minutes discussing concerns about PSA/prostate and hyperlipidemia/family history CV disease. The duration of face-to-face time during this visit was greater than 40 minutes with over 15 minutes spent specifically on problem oriented/not preventative concerns. Greater than 50% of this time was spent in counseling, explanation of diagnosis, planning of further management, and/or coordination of care including discussion as noted above. .   Return precautions advised.  Garret Reddish, MD

## 2017-01-05 NOTE — Assessment & Plan Note (Signed)
Family history CAD S: poorly controlled on no statin. 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 Also worried about effects on testosterone. On the other hand he is very worried about his cardiac risk. Had stress test in 2012 which was low risk. Mother with MI in young 52s.  Lab Results  Component Value Date   CHOL 271 (H) 12/29/2016   HDL 46.00 12/29/2016   LDLCALC 195 (H) 02/19/2015   LDLDIRECT 164.0 12/29/2016   TRIG 289.0 (H) 12/29/2016   CHOLHDL 6 12/29/2016   A/P: patient asks about updating stress testing. Discussed with him option of POET- with his VATS since last stress test- not sure if that could affect the stress test. Also he does not particularly want to use dyes/contrast. I discussed with him possible cardiology referral to decide on testing and he agrees to this. My other concern which I discussed with him is since he is asymptomatic- would this be covered by insurance and likely cardiology has more experience with this question.

## 2017-01-15 ENCOUNTER — Ambulatory Visit (INDEPENDENT_AMBULATORY_CARE_PROVIDER_SITE_OTHER): Payer: BLUE CROSS/BLUE SHIELD | Admitting: Cardiovascular Disease

## 2017-01-15 ENCOUNTER — Encounter: Payer: Self-pay | Admitting: Cardiovascular Disease

## 2017-01-15 VITALS — BP 130/60 | HR 70 | Ht 70.5 in | Wt 249.0 lb

## 2017-01-15 DIAGNOSIS — R079 Chest pain, unspecified: Secondary | ICD-10-CM | POA: Diagnosis not present

## 2017-01-15 DIAGNOSIS — Z7689 Persons encountering health services in other specified circumstances: Secondary | ICD-10-CM

## 2017-01-15 DIAGNOSIS — R0602 Shortness of breath: Secondary | ICD-10-CM

## 2017-01-15 NOTE — Patient Instructions (Addendum)
Medication Instructions:  Your physician recommends that you continue on your current medications as directed. Please refer to the Current Medication list given to you today.  Labwork: NONE  Testing/Procedures: Cardiac CT scanning with FFR , (CAT scanning), is a noninvasive, special x-ray that produces cross-sectional images of the body using x-rays and a computer. CT scans help physicians diagnose and treat medical conditions. For some CT exams, a contrast material is used to enhance visibility in the area of the body being studied. CT scans provide greater clarity and reveal more details than regular x-ray exams.  Follow-Up: Your physician wants you to follow-up in: 12 months with Dr. Johnsie Cancel. You will receive a reminder letter in the mail two months in advance. If you don't receive a letter, please call our office to schedule the follow-up appointment.   If you need a refill on your cardiac medications before your next appointment, please call your pharmacy.

## 2017-01-15 NOTE — Progress Notes (Signed)
Cardiology Office Note   Date:  01/15/2017   ID:  Adalberto Metzgar, DOB 11/01/1955, MRN [id number]  PCP:  Garret Reddish, MD  Cardiologist:   Jenkins Rouge, MD   Chief Complaint  Patient presents with  . Establish Care      History of Present Illness: Derek Pennington is a 61 y.o. male who presents for evaluation/consultation of elevated lipids in setting of positive history of premature CAD. Reviewed his last labs and LDL 164 TC 271  Had normal ETT in 2012.  Has some chronic pain and dyspnea from previous right sided VATS procedure for pleural effusion . VATS for empyema strep.   Reviewed nuclear scan from 04/21/11 EF 64% normal exercised nine minutes On Bruce max HR 160    His mother had stents and CAD at early age. 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 Has tried lipitor and zocor in past.   He does describe exertional dyspnea and chest pain last 3 months Has had right sided pain in past from VATS but this is different. Clearly exertional Not positional. No GI overtones. Dyspnea Got better after VATS but worse last 3 months with no cough sputum or fever.   Past Medical History:  Diagnosis Date  . Acute respiratory failure with hypoxia ([place]) 12/11/2013  . ED (erectile dysfunction)   . Hyperlipidemia   . Personal history of colonic adenomas 06/07/2013   2009 - diminutive polyp removed not recovered 05/2013 2 diminutive adenomas Repeat colon 2019 [name], MD, [facility]   . Testosterone deficiency     Past Surgical History:  Procedure Laterality Date  . DECORTICATION  12/13/2013   Procedure: DECORTICATION;  Surgeon: Grace Isaac, MD;  Location: [facility];  Service: Thoracic;;  . FINGER FRACTURE SURGERY     fifth R  . lipoma back  1983  . PLEURAL EFFUSION DRAINAGE Right 12/13/2013   Procedure: DRAINAGE OF PLEURAL EFFUSION;  Surgeon: Grace Isaac, MD;  Location:  [facility];  Service: Thoracic;  Laterality: Right;  Marland Kitchen VIDEO ASSISTED THORACOSCOPY Right 12/13/2013   Procedure: VIDEO ASSISTED THORACOSCOPY;  Surgeon: Grace Isaac, MD;  Location: [facility];  Service: Thoracic;  Laterality: Right;  Marland Kitchen VIDEO BRONCHOSCOPY Right 12/13/2013   Procedure: VIDEO BRONCHOSCOPY;  Surgeon: Grace Isaac, MD;  Location: [facility];  Service: Thoracic;  Laterality: Right;     No current outpatient prescriptions on file.   No current facility-administered medications for this visit.     Allergies:   Patient has no known allergies.    Social History:  The patient  reports that he has quit smoking. He has never used smokeless tobacco. He reports that he drinks about 2.4 oz of alcohol per week . He reports that he does not use drugs.   Family History:  The patient's family history includes Aneurysm in his father; Heart attack in his father; Heart disease in his mother; Leukemia in his mother.    ROS:  Please see the history of present illness.   Otherwise, review of systems are positive for none.   All other systems are reviewed and negative.    PHYSICAL EXAM: VS:  BP 130/60   Pulse 70   Ht 5' 10.5" (1.791 m)   Wt 249 lb (112.9 kg)   SpO2 97%   BMI 35.22 kg/m  , BMI Body mass index is  35.22 kg/m. Affect appropriate Healthy:  appears stated age 73: normal Neck supple with no adenopathy JVP normal no bruits no thyromegaly Lungs clear with no wheezing and good diaphragmatic motion post right VATS  Heart:  S1/S2 no murmur, no rub, gallop or click PMI normal Abdomen: benighn, BS positve, no tenderness, no AAA no bruit.  No HSM or HJR Distal pulses intact with no bruits No edema Neuro non-focal Skin warm and dry No muscular weakness    EKG:  NSR normal ECG rate 76    Recent Labs: 12/29/2016: ALT 28; BUN 19; Creatinine, Ser 1.10; Hemoglobin 16.2; Platelets 221.0; Potassium 4.2; Sodium 137; TSH 2.13    Lipid Panel    Component Value Date/Time   CHOL  271 (H) 12/29/2016 1002   TRIG 289.0 (H) 12/29/2016 1002   HDL 46.00 12/29/2016 1002   CHOLHDL 6 12/29/2016 1002   VLDL 57.8 (H) 12/29/2016 1002   LDLCALC 195 (H) 02/19/2015 0919   LDLDIRECT 164.0 12/29/2016 1002      Wt Readings from Last 3 Encounters:  01/15/17 249 lb (112.9 kg)  01/05/17 252 lb 12.8 oz (114.7 kg)  02/19/15 246 lb (111.6 kg)      Other studies Reviewed: Additional studies/ records that were reviewed today include: Notes Dr Yong Channel labs CXR And ECG Old myovue from 2012 .    ASSESSMENT AND PLAN:  1.  Chest Pain: concern for family history elevated lipids Prefer cardiac CTA with calcium score and possible FFR CT as best test to risk stratify, R.O CAD and if needed assess physiologic significance of any stenosis. If not approved will try calcium score and ETT 2. Cholesterol. Would be hard to get him in primary prevention trial as PSK9 usually for secondary prevention. If calcium score over 50th percentile for age may suggest trial of crestor 3x/week 3. Dyspnea:  Normal exam r/o ischemia post VATS with emphyema which may contribute to restrictive lung disease consider PFTls and echo once issue of cholesterol and CAD resolved   Current medicines are reviewed at length with the patient today.  The patient does not have concerns regarding medicines.  The following changes have been made:  no change  Labs/ tests ordered today include: Cardicat CAT/Calcium Score and FFRCT  Orders Placed This Encounter  Procedures  . CT COROARY FRACTIONAL FLOW RESERVE (FFR)  . CT CORONARY MORPH W/CTA COR W/SCORE W/CA W/CM &/OR WO/CM  . CT CORONARY FRACTIONAL FLOW RESERVE DATA PREP  . CT CORONARY FRACTIONAL FLOW RESERVE FLUID ANALYSIS  . EKG 12-Lead     Disposition:   FU with me in a year if CT ok      Signed, Jenkins Rouge, MD  01/15/2017 10:13 PM    [facility] [place], [place], [place]  [address] Phone: [phone]; Fax: [phone]

## 2017-01-22 ENCOUNTER — Encounter: Payer: Self-pay | Admitting: Cardiovascular Disease

## 2017-01-29 ENCOUNTER — Encounter: Payer: Self-pay | Admitting: Cardiovascular Disease

## 2017-02-03 ENCOUNTER — Telehealth: Payer: Self-pay | Admitting: Cardiovascular Disease

## 2017-02-03 DIAGNOSIS — R0602 Shortness of breath: Secondary | ICD-10-CM

## 2017-02-03 DIAGNOSIS — R079 Chest pain, unspecified: Secondary | ICD-10-CM

## 2017-02-03 NOTE — Telephone Encounter (Signed)
New message    [facility] with [facility] called needing other order for SSR. Requesting call back

## 2017-02-03 NOTE — Telephone Encounter (Signed)
[facility] with [facility]. Order number has changed needed new order for patient to have CT.

## 2017-02-05 ENCOUNTER — Ambulatory Visit (HOSPITAL_COMMUNITY)
Admission: RE | Admit: 2017-02-05 | Discharge: 2017-02-05 | Disposition: A | Discharge status: Home / Self Care | Payer: BLUE CROSS/BLUE SHIELD | Source: Ambulatory Visit | Attending: Cardiovascular Disease | Admitting: Cardiovascular Disease

## 2017-02-05 ENCOUNTER — Encounter (HOSPITAL_COMMUNITY): Payer: Self-pay

## 2017-02-05 DIAGNOSIS — I700 Atherosclerosis of aorta: Secondary | ICD-10-CM | POA: Diagnosis not present

## 2017-02-05 DIAGNOSIS — R0602 Shortness of breath: Secondary | ICD-10-CM | POA: Insufficient documentation

## 2017-02-05 DIAGNOSIS — R079 Chest pain, unspecified: Secondary | ICD-10-CM

## 2017-02-05 DIAGNOSIS — R918 Other nonspecific abnormal finding of lung field: Secondary | ICD-10-CM | POA: Insufficient documentation

## 2017-02-05 MED ORDER — IOPAMIDOL (ISOVUE-370) INJECTION 76%
INTRAVENOUS | Status: AC
  Administered 2017-02-05: 80 mL
  Filled 2017-02-05: qty 100

## 2017-02-05 MED ORDER — METOPROLOL TARTRATE 5 MG/5ML IV SOLN
INTRAVENOUS | Status: AC
  Administered 2017-02-05: 10:00:00
  Filled 2017-02-05: qty 15

## 2017-02-05 MED ORDER — NITROGLYCERIN 0.4 MG SL SUBL
SUBLINGUAL_TABLET | SUBLINGUAL | Status: AC
  Filled 2017-02-05: qty 1

## 2017-02-07 NOTE — Progress Notes (Signed)
Cardiology Office Note   Date:  02/08/2017   ID:  Derek Pennington, DOB 1955/11/07, MRN [id number]  PCP:  Garret Reddish, MD  Cardiologist:   Jenkins Rouge, MD   Chief Complaint  Patient presents with  . Shortness of Breath  . Chest Pain      History of Present Illness: Derek Pennington is a 61 y.o. male who presents for evaluation/consultation of elevated lipids in setting of positive history of premature CAD. First seen 01/15/17  Reviewed his last labs and LDL 164 TC 271  Had normal ETT in 2012.  Has some chronic pain and dyspnea from previous right sided VATS procedure for pleural effusion . VATS for empyema strep.   Reviewed nuclear scan from 04/21/11 EF 64% normal exercised nine minutes On Bruce max HR 160    His mother had stents and CAD at early age. 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 Has tried lipitor and zocor in past.   Has had right sided pain in past from VATS   F/U cardiac CT done 02/05/17 Calcium Score 115 67th percentile No stenotic lesions of significance Aortic atherosclerosis as well   Past Medical History:  Diagnosis Date  . Acute respiratory failure with hypoxia ([place]) 12/11/2013  . ED (erectile dysfunction)   . Hyperlipidemia   . Personal history of colonic adenomas 06/07/2013   2009 - diminutive polyp removed not recovered 05/2013 2 diminutive adenomas Repeat colon 2019 [name], MD, [facility]   . Testosterone deficiency     Past Surgical History:  Procedure Laterality Date  . DECORTICATION  12/13/2013   Procedure: DECORTICATION;  Surgeon: Grace Isaac, MD;  Location: [facility];  Service: Thoracic;;  . FINGER FRACTURE SURGERY     fifth R  . lipoma back  1983  . PLEURAL EFFUSION DRAINAGE Right 12/13/2013   Procedure: DRAINAGE OF PLEURAL EFFUSION;  Surgeon: Grace Isaac, MD;  Location: [facility];  Service: Thoracic;  Laterality: Right;    Marland Kitchen VIDEO ASSISTED THORACOSCOPY Right 12/13/2013   Procedure: VIDEO ASSISTED THORACOSCOPY;  Surgeon: Grace Isaac, MD;  Location: [facility];  Service: Thoracic;  Laterality: Right;  Marland Kitchen VIDEO BRONCHOSCOPY Right 12/13/2013   Procedure: VIDEO BRONCHOSCOPY;  Surgeon: Grace Isaac, MD;  Location: [facility];  Service: Thoracic;  Laterality: Right;     No current outpatient prescriptions on file.   No current facility-administered medications for this visit.     Allergies:   Patient has no known allergies.    Social History:  The patient  reports that he has quit smoking. He has never used smokeless tobacco. He reports that he drinks about 2.4 oz of alcohol per week . He reports that he does not use drugs.   Family History:  The patient's family history includes Aneurysm in his father; Heart attack in his father; Heart disease in his mother; Leukemia in his mother.    ROS:  Please see the history of present illness.   Otherwise, review of systems are positive for none.   All other systems are reviewed and negative.    PHYSICAL EXAM: VS:  BP 118/80 (BP Location: Left Arm, Patient Position: Sitting, Cuff Size: Large)   Pulse 87   Ht 5\' 11"  (1.803 m)   Wt 248 lb (112.5 kg)   SpO2 97%   BMI 34.59 kg/m  , BMI Body  mass index is 34.59 kg/m. Affect appropriate Healthy:  appears stated age 61: normal Neck supple with no adenopathy JVP normal no bruits no thyromegaly Lungs clear with no wheezing and good diaphragmatic motion post right VATS  Heart:  S1/S2 no murmur, no rub, gallop or click PMI normal Abdomen: benighn, BS positve, no tenderness, no AAA no bruit.  No HSM or HJR Distal pulses intact with no bruits No edema Neuro non-focal Skin warm and dry No muscular weakness    EKG:  NSR normal ECG rate 76    Recent Labs: 12/29/2016: ALT 28; BUN 19; Creatinine, Ser 1.10; Hemoglobin 16.2; Platelets 221.0; Potassium 4.2; Sodium 137; TSH 2.13    Lipid Panel    Component Value  Date/Time   CHOL 271 (H) 12/29/2016 1002   TRIG 289.0 (H) 12/29/2016 1002   HDL 46.00 12/29/2016 1002   CHOLHDL 6 12/29/2016 1002   VLDL 57.8 (H) 12/29/2016 1002   LDLCALC 195 (H) 02/19/2015 0919   LDLDIRECT 164.0 12/29/2016 1002      Wt Readings from Last 3 Encounters:  02/08/17 248 lb (112.5 kg)  01/15/17 249 lb (112.9 kg)  01/05/17 252 lb 12.8 oz (114.7 kg)      Other studies Reviewed: Additional studies/ records that were reviewed today include: Notes Dr Yong Channel labs CXR And ECG Old myovue from 2012 .    ASSESSMENT AND PLAN:  1.  CAD:  f/u ETT given high calcium score and family history  2. Cholesterol. Showed him his pictures of calcium score and aortic atherosclerosis start crestor 5 mg M/W/F and repeat labs in 3 months  3. Dyspnea:  Likely post VATS restrictive lung disease consider f/u PFT;s and echo per primary    Current medicines are reviewed at length with the patient today.  The patient does not have concerns regarding medicines.  The following changes have been made:  Crestor 5 mg   Labs/ tests ordered today include: ETT  No orders of the defined types were placed in this encounter.    Disposition:   FU with me in 6 months     Signed, Jenkins Rouge, MD  02/08/2017 9:07 AM    [facility] [place], [place], [place]  [address] Phone: [phone]; Fax: [phone]

## 2017-02-08 ENCOUNTER — Ambulatory Visit (INDEPENDENT_AMBULATORY_CARE_PROVIDER_SITE_OTHER): Payer: BLUE CROSS/BLUE SHIELD | Admitting: Cardiovascular Disease

## 2017-02-08 ENCOUNTER — Encounter: Payer: Self-pay | Admitting: Cardiovascular Disease

## 2017-02-08 VITALS — BP 118/80 | HR 87 | Ht 71.0 in | Wt 248.0 lb

## 2017-02-08 DIAGNOSIS — Z79899 Other long term (current) drug therapy: Secondary | ICD-10-CM | POA: Diagnosis not present

## 2017-02-08 DIAGNOSIS — R079 Chest pain, unspecified: Secondary | ICD-10-CM | POA: Diagnosis not present

## 2017-02-08 DIAGNOSIS — E785 Hyperlipidemia, unspecified: Secondary | ICD-10-CM | POA: Diagnosis not present

## 2017-02-08 DIAGNOSIS — R0602 Shortness of breath: Secondary | ICD-10-CM | POA: Diagnosis not present

## 2017-02-08 MED ORDER — ROSUVASTATIN CALCIUM 5 MG PO TABS: 5.0000 mg | ORAL_TABLET | Freq: Every day | ORAL | 3 refills | Status: DC

## 2017-02-08 NOTE — Patient Instructions (Addendum)
Medication Instructions:  Your physician has recommended you make the following change in your medication:  1-START Crestor 5 mg by mouth daily  Labwork: Your physician recommends that you return for lab work in: 3 months with lipid and liver panel.  Testing/Procedures: Your physician has requested that you have an exercise tolerance test. For further information please visit [email or web address]. Please also follow instruction sheet, as given.  Follow-Up: Your physician wants you to follow-up in: 6 months with Dr. Johnsie Cancel. You will receive a reminder letter in the mail two months in advance. If you don't receive a letter, please call our office to schedule the follow-up appointment.   If you need a refill on your cardiac medications before your next appointment, please call your pharmacy.

## 2017-02-18 ENCOUNTER — Ambulatory Visit (INDEPENDENT_AMBULATORY_CARE_PROVIDER_SITE_OTHER): Payer: BLUE CROSS/BLUE SHIELD

## 2017-02-18 DIAGNOSIS — R079 Chest pain, unspecified: Secondary | ICD-10-CM | POA: Diagnosis not present

## 2017-02-18 DIAGNOSIS — R0602 Shortness of breath: Secondary | ICD-10-CM

## 2017-02-18 LAB — EXERCISE TOLERANCE TEST
CHL CUP MPHR: 159 {beats}/min
CHL CUP RESTING HR STRESS: 77 {beats}/min
CHL RATE OF PERCEIVED EXERTION: 17
CSEPPHR: 151 {beats}/min
Estimated workload: 7 METS
Exercise duration (min): 6 min
Exercise duration (sec): 0 s
Percent HR: 94 %

## 2017-02-25 ENCOUNTER — Encounter: Payer: Self-pay | Admitting: Family Medicine

## 2017-02-25 ENCOUNTER — Ambulatory Visit (INDEPENDENT_AMBULATORY_CARE_PROVIDER_SITE_OTHER): Payer: BLUE CROSS/BLUE SHIELD | Admitting: Family Medicine

## 2017-02-25 VITALS — BP 130/96 | HR 99 | Temp 98.4°F | Wt 246.0 lb

## 2017-02-25 DIAGNOSIS — J029 Acute pharyngitis, unspecified: Secondary | ICD-10-CM | POA: Diagnosis not present

## 2017-02-25 LAB — POCT RAPID STREP A (OFFICE): RAPID STREP A SCREEN: NEGATIVE

## 2017-02-25 NOTE — Progress Notes (Signed)
Subjective:  Ladarrion Telfair is a 61 y.o. year old very pleasant male patient who presents for/with See problem oriented charting ROS- subjective fevers. Some chills. Some sweaty. Admits to sore throat. No shortness of breath or chest pain   Past Medical History-  Patient Active Problem List   Diagnosis Date Noted  . BPH with urinary obstruction 01/20/2010    Priority: Medium  . Hyperlipidemia 04/25/2007    Priority: Medium  . Former smoker 02/19/2015    Priority: Low  . Empyema of right pleural space ([place]) 12/11/2013    Priority: Low  . Personal history of colonic adenomas 06/07/2013    Priority: Low  . Hypogonadism in male 01/20/2010    Priority: Low  . Rectal bleeding 09/01/2015    Medications- reviewed and updated Current Outpatient Prescriptions  Medication Sig Dispense Refill  . rosuvastatin (CRESTOR) 5 MG tablet Take 1 tablet (5 mg total) by mouth daily. 90 tablet 3   No current facility-administered medications for this visit.     Objective: BP (!) 130/96 (BP Location: Left Arm, Patient Position: Sitting, Cuff Size: Large)   Pulse 99   Temp 98.4 F (36.9 C) (Oral)   Wt 246 lb (111.6 kg)   SpO2 98%   BMI 34.31 kg/m  Gen: NAD, resting comfortably TM normal, nares normal, pharynx erythematous with swollen tonsils but no exudate, Mucous membranes are moist. No cervical lymphadenopathy CV: RRR no murmurs rubs or gallops Lungs: CTAB no crackles, wheeze, rhonchi Ext: no edema Skin: warm, dry  Assessment/Plan:  Viral pharyngitis Sore throat - Plan: POCT rapid strep A: negative S: 3 days of mild sore throat now worsening, Feels subjective fevers, cough but mainly dry. Also has mild headache. Feels some sore areas in mouth. Pain in throat with coughing or breathing cold air (also happened to him in past winter) A/P: Viral pharyngitis. Centor 0 so low pretest probability and testing with rapid negative- we discussed low yield of sending off for cultures.   Orders  Placed This Encounter  Procedures  . POCT rapid strep A    Return precautions advised.  Garret Reddish, MD

## 2017-02-25 NOTE — Patient Instructions (Addendum)
Strep test negative.  I would schedule tylenol 650 mg every 6 hours. If not helping could use ibuprofen 400mg  every 6 hours for up to 5 days.   This or tomorrow should be worst day of symptoms. Usually clears within 10 days. Happy to reevaluate if persistent symptoms next week particularly if temperatures above 101. See [place] back for worsening symptoms  Pharyngitis Pharyngitis is redness, pain, and swelling (inflammation) of your pharynx. What are the causes? Pharyngitis is usually caused by infection. Most of the time, these infections are from viruses (viral) and are part of a cold. However, sometimes pharyngitis is caused by bacteria (bacterial). Pharyngitis can also be caused by allergies. Viral pharyngitis may be spread from person to person by coughing, sneezing, and personal items or utensils (cups, forks, spoons, toothbrushes). Bacterial pharyngitis may be spread from person to person by more intimate contact, such as kissing. What are the signs or symptoms? Symptoms of pharyngitis include:  Sore throat.  Tiredness (fatigue).  Low-grade fever.  Headache.  Joint pain and muscle aches.  Skin rashes.  Swollen lymph nodes.  Plaque-like film on throat or tonsils (often seen with bacterial pharyngitis). How is this diagnosed? Your health care provider will ask you questions about your illness and your symptoms. Your medical history, along with a physical exam, is often all that is needed to diagnose pharyngitis. Sometimes, a rapid strep test is done. Other lab tests may also be done, depending on the suspected cause. How is this treated? Viral pharyngitis will usually get better in 5-7 days without the use of medicine. Some cases last up to 10 days. Bacterial pharyngitis is treated with medicines that kill germs (antibiotics). Follow these instructions at home:  Drink enough water and fluids to keep your urine clear or pale yellow.  Only take over-the-counter or prescription  medicines as directed by your health care provider:  Get lots of rest.  Gargle with 8 oz of salt water ( tsp of salt per 1 qt of water) as often as every 1-2 hours to soothe your throat.  Throat lozenges (if you are not at risk for choking) or sprays may be used to soothe your throat. Contact a health care provider if:  You have large, tender lumps in your neck.  You have a rash.  You cough up green, yellow-brown, or bloody spit. Get help right away if:  Your neck becomes stiff.  You drool or are unable to swallow liquids.  You vomit or are unable to keep medicines or liquids down.  You have severe pain that does not go away with the use of recommended medicines.  You have trouble breathing (not caused by a stuffy nose). This information is not intended to replace advice given to you by your health care provider. Make sure you discuss any questions you have with your health care provider. Document Released: 10/05/2005 Document Revised: 03/12/2016 Document Reviewed: 06/12/2013 Elsevier Interactive Patient Education  2017 [place] NOW OFFER   [facility] FAST TRACK!!!  SAME DAY Appointments for ACUTE CARE  Such as: Sprains, Injuries, cuts, abrasions, rashes, muscle pain, joint pain, back pain Colds, flu, sore throats, headache, allergies, cough, fever  Ear pain, sinus and eye infections Abdominal pain, nausea, vomiting, diarrhea, upset stomach Animal/insect bites  3 Easy Ways to Schedule: Walk-In Scheduling Call in scheduling Mychart Sign-up: [email or web address]

## 2017-03-04 DIAGNOSIS — N5201 Erectile dysfunction due to arterial insufficiency: Secondary | ICD-10-CM | POA: Diagnosis not present

## 2017-03-04 DIAGNOSIS — N401 Enlarged prostate with lower urinary tract symptoms: Secondary | ICD-10-CM | POA: Diagnosis not present

## 2017-03-04 DIAGNOSIS — R3912 Poor urinary stream: Secondary | ICD-10-CM | POA: Diagnosis not present

## 2017-03-04 DIAGNOSIS — R972 Elevated prostate specific antigen [PSA]: Secondary | ICD-10-CM | POA: Diagnosis not present

## 2017-03-08 ENCOUNTER — Encounter: Payer: Self-pay | Admitting: Family Medicine

## 2017-03-23 DIAGNOSIS — R972 Elevated prostate specific antigen [PSA]: Secondary | ICD-10-CM | POA: Diagnosis not present

## 2017-04-20 DIAGNOSIS — R972 Elevated prostate specific antigen [PSA]: Secondary | ICD-10-CM | POA: Diagnosis not present

## 2017-04-20 DIAGNOSIS — R3912 Poor urinary stream: Secondary | ICD-10-CM | POA: Diagnosis not present

## 2017-04-20 DIAGNOSIS — N401 Enlarged prostate with lower urinary tract symptoms: Secondary | ICD-10-CM | POA: Diagnosis not present

## 2017-05-04 ENCOUNTER — Other Ambulatory Visit: Payer: BLUE CROSS/BLUE SHIELD

## 2017-05-04 DIAGNOSIS — Z79899 Other long term (current) drug therapy: Secondary | ICD-10-CM | POA: Diagnosis not present

## 2017-05-04 DIAGNOSIS — E785 Hyperlipidemia, unspecified: Secondary | ICD-10-CM

## 2017-05-04 LAB — LIPID PANEL
CHOL/HDL RATIO: 3.7 ratio (ref 0.0–5.0)
Cholesterol, Total: 159 mg/dL (ref 100–199)
HDL: 43 mg/dL (ref >39)
LDL CALC: 84 mg/dL (ref 0–99)
Triglycerides: 161 mg/dL — ABNORMAL HIGH (ref 0–149)
VLDL Cholesterol Cal: 32 mg/dL (ref 5–40)

## 2017-05-04 LAB — HEPATIC FUNCTION PANEL
ALBUMIN: 4.2 g/dL (ref 3.6–4.8)
ALT: 28 IU/L (ref 0–44)
AST: 23 IU/L (ref 0–40)
Alkaline Phosphatase: 71 IU/L (ref 39–117)
BILIRUBIN TOTAL: 0.7 mg/dL (ref 0.0–1.2)
Bilirubin, Direct: 0.2 mg/dL (ref 0.00–0.40)
TOTAL PROTEIN: 7.2 g/dL (ref 6.0–8.5)

## 2017-05-05 ENCOUNTER — Encounter: Payer: Self-pay | Admitting: Family Medicine

## 2017-05-06 ENCOUNTER — Encounter: Payer: Self-pay | Admitting: Cardiovascular Disease

## 2017-05-19 ENCOUNTER — Telehealth: Payer: Self-pay | Admitting: Cardiovascular Disease

## 2017-05-19 NOTE — Telephone Encounter (Signed)
Patient calling, states that he is returning call from yesterday

## 2017-05-19 NOTE — Telephone Encounter (Signed)
Called patient with his lab work results. See result notes.

## 2017-06-15 DIAGNOSIS — T1511XA Foreign body in conjunctival sac, right eye, initial encounter: Secondary | ICD-10-CM | POA: Diagnosis not present

## 2018-03-18 ENCOUNTER — Other Ambulatory Visit: Payer: Self-pay | Admitting: Cardiovascular Disease

## 2018-03-18 DIAGNOSIS — K598 Other specified functional intestinal disorders: Secondary | ICD-10-CM | POA: Diagnosis not present

## 2018-06-28 DIAGNOSIS — H52223 Regular astigmatism, bilateral: Secondary | ICD-10-CM | POA: Diagnosis not present

## 2018-06-28 DIAGNOSIS — H524 Presbyopia: Secondary | ICD-10-CM | POA: Diagnosis not present

## 2018-07-25 ENCOUNTER — Ambulatory Visit (INDEPENDENT_AMBULATORY_CARE_PROVIDER_SITE_OTHER): Payer: BLUE CROSS/BLUE SHIELD | Admitting: Family Medicine

## 2018-07-25 ENCOUNTER — Encounter: Payer: Self-pay | Admitting: Family Medicine

## 2018-07-25 ENCOUNTER — Encounter

## 2018-07-25 ENCOUNTER — Ambulatory Visit (INDEPENDENT_AMBULATORY_CARE_PROVIDER_SITE_OTHER): Payer: BLUE CROSS/BLUE SHIELD

## 2018-07-25 VITALS — BP 100/76 | HR 63 | Temp 98.3°F | Ht 71.0 in | Wt 246.2 lb

## 2018-07-25 DIAGNOSIS — R6882 Decreased libido: Secondary | ICD-10-CM | POA: Diagnosis not present

## 2018-07-25 DIAGNOSIS — Z860100 Personal history of colon polyps, unspecified: Secondary | ICD-10-CM

## 2018-07-25 DIAGNOSIS — E785 Hyperlipidemia, unspecified: Secondary | ICD-10-CM

## 2018-07-25 DIAGNOSIS — R079 Chest pain, unspecified: Secondary | ICD-10-CM | POA: Diagnosis not present

## 2018-07-25 DIAGNOSIS — Z1159 Encounter for screening for other viral diseases: Secondary | ICD-10-CM | POA: Diagnosis not present

## 2018-07-25 DIAGNOSIS — E291 Testicular hypofunction: Secondary | ICD-10-CM

## 2018-07-25 DIAGNOSIS — N401 Enlarged prostate with lower urinary tract symptoms: Secondary | ICD-10-CM | POA: Diagnosis not present

## 2018-07-25 DIAGNOSIS — N138 Other obstructive and reflux uropathy: Secondary | ICD-10-CM

## 2018-07-25 DIAGNOSIS — Z125 Encounter for screening for malignant neoplasm of prostate: Secondary | ICD-10-CM

## 2018-07-25 DIAGNOSIS — R0781 Pleurodynia: Secondary | ICD-10-CM | POA: Diagnosis not present

## 2018-07-25 LAB — CBC
HCT: 43.4 % (ref 39.0–52.0)
Hemoglobin: 15.1 g/dL (ref 13.0–17.0)
MCHC: 34.8 g/dL (ref 30.0–36.0)
MCV: 84.9 fl (ref 78.0–100.0)
Platelets: 219 10*3/uL (ref 150.0–400.0)
RBC: 5.12 Mil/uL (ref 4.22–5.81)
RDW: 14.1 % (ref 11.5–15.5)
WBC: 6.1 10*3/uL (ref 4.0–10.5)

## 2018-07-25 LAB — COMPREHENSIVE METABOLIC PANEL
ALBUMIN: 4.3 g/dL (ref 3.5–5.2)
ALK PHOS: 61 U/L (ref 39–117)
ALT: 21 U/L (ref 0–53)
AST: 21 U/L (ref 0–37)
BUN: 21 mg/dL (ref 6–23)
CO2: 26 mEq/L (ref 19–32)
CREATININE: 1.08 mg/dL (ref 0.40–1.50)
Calcium: 9.3 mg/dL (ref 8.4–10.5)
Chloride: 102 mEq/L (ref 96–112)
GFR: 73.49 mL/min (ref >60.00)
GLUCOSE: 101 mg/dL — AB (ref 70–99)
Potassium: 4.3 mEq/L (ref 3.5–5.1)
SODIUM: 137 meq/L (ref 135–145)
TOTAL PROTEIN: 7.3 g/dL (ref 6.0–8.3)
Total Bilirubin: 1.3 mg/dL — ABNORMAL HIGH (ref 0.2–1.2)

## 2018-07-25 LAB — LIPID PANEL
CHOLESTEROL: 172 mg/dL (ref 0–200)
HDL: 46.3 mg/dL (ref >39.00)
LDL Cholesterol: 102 mg/dL — ABNORMAL HIGH (ref 0–99)
NONHDL: 125.77
Total CHOL/HDL Ratio: 4
Triglycerides: 117 mg/dL (ref 0.0–149.0)
VLDL: 23.4 mg/dL (ref 0.0–40.0)

## 2018-07-25 LAB — PSA: PSA: 5.25 ng/mL — AB (ref 0.10–4.00)

## 2018-07-25 MED ORDER — ROSUVASTATIN CALCIUM 5 MG PO TABS: 5.0000 mg | ORAL_TABLET | Freq: Every day | ORAL | 3 refills | Status: DC

## 2018-07-25 MED ORDER — TADALAFIL 20 MG PO TABS: 20.0000 mg | ORAL_TABLET | ORAL | 11 refills | Status: DC | PRN

## 2018-07-25 NOTE — Assessment & Plan Note (Signed)
S: patient has been on sildenafil- up to 80mg  and no success. He is requesting change to cialis. No fecal or urinary incontinence.   Libido remains low. Had been on topicals in past which didn't help much. Later on injections. Need to verify but do not believe he is on treatment at present A/P: update testosterone- urology could treat if low since we are trying to get him plugged back in anyway. Will send in cialis for him to see if has more success

## 2018-07-25 NOTE — Progress Notes (Signed)
Subjective:  Derek Pennington is a 62 y.o. year old very pleasant male patient who presents for/with See problem oriented charting ROS- No chest pain or shortness of breath. No headache or blurry vision. Some erectile issues   Past Medical History-  Patient Active Problem List   Diagnosis Date Noted  . BPH with urinary obstruction 01/20/2010    Priority: Medium  . Hyperlipidemia 04/25/2007    Priority: Medium  . Former smoker 02/19/2015    Priority: Low  . Empyema of right pleural space ([place]) 12/11/2013    Priority: Low  . Personal history of colonic adenomas 06/07/2013    Priority: Low  . Hypogonadism in male 01/20/2010    Priority: Low  . Rectal bleeding 09/01/2015    Medications- reviewed and updated Current Outpatient Medications  Medication Sig Dispense Refill  . rosuvastatin (CRESTOR) 5 MG tablet Take 1 tablet (5 mg total) by mouth daily. 90 tablet 3  . sildenafil (REVATIO) 20 MG tablet Take 1-5 tablets by mouth as needed    . silodosin (RAPAFLO) 8 MG CAPS capsule Take 8 mg by mouth daily with breakfast.    . tadalafil (ADCIRCA/CIALIS) 20 MG tablet Take 1 tablet (20 mg total) by mouth every other day as needed for erectile dysfunction. 5 tablet 11   No current facility-administered medications for this visit.     Objective: BP 100/76 (BP Location: Left Arm, Patient Position: Sitting, Cuff Size: Large)   Pulse 63   Temp 98.3 F (36.8 C) (Oral)   Ht 5\' 11"  (1.803 m)   Wt 246 lb 3.2 oz (111.7 kg)   SpO2 97%   BMI 34.34 kg/m  Gen: NAD, resting comfortably Slight white plaque roof of mouth on left- easily scaped off. TM occluded by cerumen bilaterally (wants to get this cleaned out at work)  CV: RRR no murmurs rubs or gallops Lungs: CTAB no crackles, wheeze, rhonchi Abdomen: soft/nontender/nondistended/normal bowel sounds.   Ext: no edema Skin: warm, dry Neuro: grossly normal, moves all extremities  Assessment/Plan:  Other notes: 1. Watching what he is eating  (weight still up from his 220s to 230s from a few year ago). Exercise down. High stress - we discussed working on improving these #s.  2. White plaque for 2-3 days on roof of left side of mouth- getting better. We were able to scrape off most of remainder- will monitor for recurrence- ? Thrush. Monitor only   Right-sided chest pain - Plan: DG Ribs Unilateral W/Chest Right S: Patient with VATS in 2015 reltaed to strep pneumonia. He has continued to have right sided pain in site of the surgery.  A/P: We will get dedicated rib film on that side for a closer picture- doubt recurrence of pneumonia but will evaluate for that or rib abnormality.   BPH with urinary obstruction S:patient following with urology for BPH- likely leading to his elevated PSA above 5- had biopsy with no prostate cancer  ( I dont have biopsy results). Rapaflo is costly- helps some with his weak stream but not taking due to retrograde ejaculation Lab Results  Component Value Date   PSA 5.25 (H) 07/25/2018   PSA 4.43 (H) 12/29/2016   PSA 4.43 12/27/2016  A/P: I believe his PSA at urology was above 5 as noted above but I still think he should follow along with them for guidance on repeat PSA measurements/treatment. I would at least if he is to follow with [place] here- have some guidelines on when to refer back.  Hyperlipidemia S: reasonably controlled on crestor 5 mg last July with LDL of 84 A/P: update lipid panel today - continue current rx if LDL under 100  Hypogonadism in male S: patient has been on sildenafil- up to 80mg  and no success. He is requesting change to cialis. No fecal or urinary incontinence.   Libido remains low. Had been on topicals in past which didn't help much. Later on injections. Need to verify but do not believe he is on treatment at present A/P: update testosterone- urology could treat if low since we are trying to get him plugged back in anyway. Will send in cialis for him to see if has more  success  Lab/Order associations: fasting Personal history of colonic adenomas - Plan: Ambulatory referral to Gastroenterology  Right-sided chest pain - Plan: DG Ribs Unilateral W/Chest Right  Hyperlipidemia, unspecified hyperlipidemia type - Plan: CBC, Comprehensive metabolic panel, Lipid panel  Low libido - Plan: Testos,Total,Free and SHBG (Male)  Screening PSA (prostate specific antigen) - Plan: PSA  Encounter for HCV screening test for low risk patient - Plan: Hepatitis C antibody  BPH with urinary obstruction  Hypogonadism in male  Meds ordered this encounter  Medications  . rosuvastatin (CRESTOR) 5 MG tablet    Sig: Take 1 tablet (5 mg total) by mouth daily.    Dispense:  90 tablet    Refill:  3  . tadalafil (ADCIRCA/CIALIS) 20 MG tablet    Sig: Take 1 tablet (20 mg total) by mouth every other day as needed for erectile dysfunction.    Dispense:  5 tablet    Refill:  11   Return precautions advised.  Garret Reddish, MD

## 2018-07-25 NOTE — Patient Instructions (Addendum)
We will call you within two weeks about your referral to Dr. Carlean Purl. If you do not hear within 3 weeks, give [place] a call.   No changes today unless labs lead [place] to make changes  Let us know if recurrence of plaque on roof of mouth   Please stop by lab and x-ray before you go

## 2018-07-25 NOTE — Assessment & Plan Note (Signed)
S: reasonably controlled on crestor 5 mg last July with LDL of 84 A/P: update lipid panel today - continue current rx if LDL under 100

## 2018-07-25 NOTE — Assessment & Plan Note (Signed)
S:patient following with urology for BPH- likely leading to his elevated PSA above 5- had biopsy with no prostate cancer  ( I dont have biopsy results). Rapaflo is costly- helps some with his weak stream but not taking due to retrograde ejaculation Lab Results  Component Value Date   PSA 5.25 (H) 07/25/2018   PSA 4.43 (H) 12/29/2016   PSA 4.43 12/27/2016  A/P: I believe his PSA at urology was above 5 as noted above but I still think he should follow along with them for guidance on repeat PSA measurements/treatment. I would at least if he is to follow with [place] here- have some guidelines on when to refer back.

## 2018-07-28 LAB — HEPATITIS C ANTIBODY
Hepatitis C Ab: NONREACTIVE
SIGNAL TO CUT-OFF: 0.01 (ref <1.00)

## 2018-07-28 LAB — TESTOS,TOTAL,FREE AND SHBG (FEMALE)
FREE TESTOSTERONE: 55.2 pg/mL (ref 35.0–155.0)
SEX HORMONE BINDING: 31 nmol/L (ref 22–77)
Testosterone, Total, LC-MS-MS: 310 ng/dL (ref 250–1100)

## 2018-08-02 ENCOUNTER — Other Ambulatory Visit: Payer: Self-pay

## 2018-08-02 ENCOUNTER — Ambulatory Visit (AMBULATORY_SURGERY_CENTER): Payer: Self-pay | Admitting: *Deleted

## 2018-08-02 VITALS — Ht 70.0 in | Wt 248.0 lb

## 2018-08-02 DIAGNOSIS — Z86010 Personal history of colonic polyps: Secondary | ICD-10-CM

## 2018-08-02 NOTE — Progress Notes (Signed)
Patient denies any allergies to egg or soy products. Patient denies complications with anesthesia/sedation.  Patient denies oxygen use at home and denies diet medications. Patient denied information on Colonoscopy.

## 2018-08-04 ENCOUNTER — Encounter: Payer: Self-pay | Admitting: Internal Medicine

## 2018-08-18 ENCOUNTER — Ambulatory Visit (AMBULATORY_SURGERY_CENTER): Payer: BLUE CROSS/BLUE SHIELD | Admitting: Internal Medicine

## 2018-08-18 ENCOUNTER — Encounter: Payer: Self-pay | Admitting: Internal Medicine

## 2018-08-18 VITALS — BP 121/76 | HR 63 | Temp 98.4°F | Resp 20 | Ht 71.0 in | Wt 248.0 lb

## 2018-08-18 DIAGNOSIS — D123 Benign neoplasm of transverse colon: Secondary | ICD-10-CM | POA: Diagnosis not present

## 2018-08-18 DIAGNOSIS — Z86010 Personal history of colonic polyps: Secondary | ICD-10-CM | POA: Diagnosis not present

## 2018-08-18 DIAGNOSIS — Z1211 Encounter for screening for malignant neoplasm of colon: Secondary | ICD-10-CM | POA: Diagnosis not present

## 2018-08-18 DIAGNOSIS — K635 Polyp of colon: Secondary | ICD-10-CM

## 2018-08-18 MED ORDER — SODIUM CHLORIDE 0.9 % IV SOLN: 500.0000 mL | Freq: Once | INTRAVENOUS | Status: DC

## 2018-08-18 NOTE — Progress Notes (Signed)
Pt's states no medical or surgical changes since previsit or office visit. 

## 2018-08-18 NOTE — Patient Instructions (Addendum)
I found and removed 3 small polyps today. All look benign.  I will let you know pathology results and when to have another routine colonoscopy by mail and/or My Chart.  I appreciate the opportunity to care for you. [name], MD, [facility]    Information on polyps given to you today    YOU HAD AN ENDOSCOPIC PROCEDURE TODAY AT [facility]:   Refer to the procedure report that was given to you for any specific questions about what was found during the examination.  If the procedure report does not answer your questions, please call your gastroenterologist to clarify.  If you requested that your care partner not be given the details of your procedure findings, then the procedure report has been included in a sealed envelope for you to review at your convenience later.  YOU SHOULD EXPECT: Some feelings of bloating in the abdomen. Passage of more gas than usual.  Walking can help get rid of the air that was put into your GI tract during the procedure and reduce the bloating. If you had a lower endoscopy (such as a colonoscopy or flexible sigmoidoscopy) you may notice spotting of blood in your stool or on the toilet paper. If you underwent a bowel prep for your procedure, you may not have a normal bowel movement for a few days.  Please Note:  You might notice some irritation and congestion in your nose or some drainage.  This is from the oxygen used during your procedure.  There is no need for concern and it should clear up in a day or so.  SYMPTOMS TO REPORT IMMEDIATELY:   Following lower endoscopy (colonoscopy or flexible sigmoidoscopy):  Excessive amounts of blood in the stool  Significant tenderness or worsening of abdominal pains  Swelling of the abdomen that is new, acute  Fever of 100F or higher    For urgent or emergent issues, a gastroenterologist can be reached at any hour by calling [phone].   DIET:  We do recommend a small meal at first, but  then you may proceed to your regular diet.  Drink plenty of fluids but you should avoid alcoholic beverages for 24 hours.  ACTIVITY:  You should plan to take it easy for the rest of today and you should NOT DRIVE or use heavy machinery until tomorrow (because of the sedation medicines used during the test).    FOLLOW UP: Our staff will call the number listed on your records the next business day following your procedure to check on you and address any questions or concerns that you may have regarding the information given to you following your procedure. If we do not reach you, we will leave a message.  However, if you are feeling well and you are not experiencing any problems, there is no need to return our call.  We will assume that you have returned to your regular daily activities without incident.  If any biopsies were taken you will be contacted by phone or by letter within the next 1-3 weeks.  Please call us at [phone] if you have not heard about the biopsies in 3 weeks.    SIGNATURES/CONFIDENTIALITY: You and/or your care partner have signed paperwork which will be entered into your electronic medical record.  These signatures attest to the fact that that the information above on your After Visit Summary has been reviewed and is understood.  Full responsibility of the confidentiality of this discharge information lies with  you and/or your care-partner.

## 2018-08-18 NOTE — Progress Notes (Signed)
PT taken to PACU. Monitors in place. VSS. Report given to RN. 

## 2018-08-18 NOTE — Op Note (Signed)
[facility] Patient Name: Derek Pennington Procedure Date: 08/18/2018 1:37 PM MRN: [id number] Endoscopist: [name] , MD Age: 62 Referring MD:  Date of Birth: [birth year] Gender: Male Account #: [id number] Procedure:                Colonoscopy Indications:              Surveillance: Personal history of adenomatous                            polyps on last colonoscopy 5 years ago Medicines:                Propofol per Anesthesia, Monitored Anesthesia Care Procedure:                Pre-Anesthesia Assessment:                           - Prior to the procedure, a History and Physical                            was performed, and patient medications and                            allergies were reviewed. The patient's tolerance of                            previous anesthesia was also reviewed. The risks                            and benefits of the procedure and the sedation                            options and risks were discussed with the patient.                            All questions were answered, and informed consent                            was obtained. Prior Anticoagulants: The patient has                            taken no previous anticoagulant or antiplatelet                            agents. ASA Grade Assessment: III - A patient with                            severe systemic disease. After reviewing the risks                            and benefits, the patient was deemed in                            satisfactory condition to undergo the procedure.  After obtaining informed consent, the colonoscope                            was passed under direct vision. Throughout the                            procedure, the patient's blood pressure, pulse, and                            oxygen saturations were monitored continuously. The                            Colonoscope was introduced through the anus and   advanced to the the cecum, identified by                            appendiceal orifice and ileocecal valve. The                            colonoscopy was performed without difficulty. The                            patient tolerated the procedure well. The quality                            of the bowel preparation was good. The ileocecal                            valve, appendiceal orifice, and rectum were                            photographed. The bowel preparation used was                            Miralax. Scope In: 1:42:22 PM Scope Out: 1:58:12 PM Scope Withdrawal Time: 0 hours 13 minutes 48 seconds  Total Procedure Duration: 0 hours 15 minutes 50 seconds  Findings:                 The perianal and digital rectal examinations were                            normal. Pertinent negatives include normal prostate                            (size, shape, and consistency).                           Two sessile polyps were found in the transverse                            colon. The polyps were diminutive in size. These                            polyps were removed with a cold snare. Resection  and retrieval were complete. Verification of                            patient identification for the specimen was done.                            Estimated blood loss was minimal.                           A 1 to 2 mm polyp was found in the transverse                            colon. The polyp was sessile. The polyp was removed                            with a cold biopsy forceps. Resection and retrieval                            were complete. Verification of patient                            identification for the specimen was done. Estimated                            blood loss was minimal.                           The exam was otherwise without abnormality on                            direct and retroflexion views. Complications:            No immediate  complications. Estimated Blood Loss:     Estimated blood loss was minimal. Impression:               - Two diminutive polyps in the transverse colon,                            removed with a cold snare. Resected and retrieved.                           - One 1 to 2 mm polyp in the transverse colon,                            removed with a cold biopsy forceps. Resected and                            retrieved.                           - The examination was otherwise normal on direct                            and retroflexion views.                           -  Personal history of colonic polyps. Recommendation:           - Patient has a contact number available for                            emergencies. The signs and symptoms of potential                            delayed complications were discussed with the                            patient. Return to normal activities tomorrow.                            Written discharge instructions were provided to the                            patient.                           - Resume previous diet.                           - Continue present medications.                           - Repeat colonoscopy is recommended for                            surveillance. The colonoscopy date will be                            determined after pathology results from today's                            exam become available for review. [name], MD 08/18/2018 2:04:21 PM This report has been signed electronically.

## 2018-08-18 NOTE — Progress Notes (Signed)
Called to room to assist during endoscopic procedure.  Patient ID and intended procedure confirmed with present staff. Received instructions for my participation in the procedure from the performing physician.  

## 2018-08-19 ENCOUNTER — Telehealth: Payer: Self-pay | Admitting: *Deleted

## 2018-08-19 NOTE — Telephone Encounter (Signed)
NO answer, second call.  Left message to call if questions or concerns.

## 2018-08-19 NOTE — Telephone Encounter (Signed)
No answer, Left message to call if questions or concerns.

## 2018-09-01 ENCOUNTER — Encounter: Payer: Self-pay | Admitting: Internal Medicine

## 2018-09-01 NOTE — Progress Notes (Signed)
2 diminutive ssp's and diminutive adenoma Recall 2024 My Chart

## 2018-09-07 DIAGNOSIS — M25775 Osteophyte, left foot: Secondary | ICD-10-CM | POA: Diagnosis not present

## 2018-09-07 DIAGNOSIS — L600 Ingrowing nail: Secondary | ICD-10-CM | POA: Diagnosis not present

## 2019-01-03 ENCOUNTER — Encounter: Payer: Self-pay | Admitting: Sports Medicine

## 2019-01-03 ENCOUNTER — Other Ambulatory Visit: Payer: Self-pay

## 2019-01-03 ENCOUNTER — Ambulatory Visit (INDEPENDENT_AMBULATORY_CARE_PROVIDER_SITE_OTHER): Payer: BLUE CROSS/BLUE SHIELD

## 2019-01-03 ENCOUNTER — Other Ambulatory Visit: Payer: Self-pay | Admitting: Family Medicine

## 2019-01-03 ENCOUNTER — Ambulatory Visit (INDEPENDENT_AMBULATORY_CARE_PROVIDER_SITE_OTHER): Payer: BLUE CROSS/BLUE SHIELD | Admitting: Sports Medicine

## 2019-01-03 VITALS — BP 120/80 | HR 68 | Ht 71.0 in | Wt 247.2 lb

## 2019-01-03 DIAGNOSIS — M79645 Pain in left finger(s): Secondary | ICD-10-CM | POA: Diagnosis not present

## 2019-01-03 DIAGNOSIS — S60032A Contusion of left middle finger without damage to nail, initial encounter: Secondary | ICD-10-CM | POA: Diagnosis not present

## 2019-01-03 DIAGNOSIS — S6992XA Unspecified injury of left wrist, hand and finger(s), initial encounter: Secondary | ICD-10-CM | POA: Diagnosis not present

## 2019-01-03 DIAGNOSIS — M7989 Other specified soft tissue disorders: Secondary | ICD-10-CM | POA: Diagnosis not present

## 2019-01-03 NOTE — Progress Notes (Signed)
Derek Pennington. Derek Pennington, Derek Pennington at [facility]  Derek Pennington - 63 y.o. male MRN [id number]  Date of birth: 11-23-1955  Visit Date: January 03, 2019  PCP: Marin Olp, MD   Referred by: Marin Olp, MD  SUBJECTIVE:  Chief Complaint  Patient presents with  . New Patient (Initial Visit)    L 3rd finger pain    HPI: Patient sustained a crush injury between 2 logs yesterday afternoon.  He has a medium pain but was able to bend his fingers but had acute swelling and pain directly over the PIP joint of the right third finger.  He is been in a splint since yesterday afternoon and is here for further evaluation.  Denies any significant pain at this time but is having swelling and slight discoloration.  REVIEW OF SYSTEMS: No significant nighttime awakenings due to this issue. Denies fevers, chills, recent weight gain or weight loss.  No night sweats.  Pt denies any change in bowel or bladder habits, muscle weakness, numbness or falls associated with this pain. 12 point review of systems performed and is negative    HISTORY:  Prior history reviewed and updated per electronic medical record.  Patient Active Problem List   Diagnosis Date Noted  . Rectal bleeding 09/01/2015  . Former smoker 02/19/2015    Quit 1995. 5 pack years. AAA screen at 48, dad with AAA   . Empyema of right pleural space ([place]) 12/11/2013    11/2013 required VATS- Related to strep CAP   . Personal history of colonic adenomas 06/07/2013    2009 - diminutive polyp removed not recovered 05/2013 2 diminutive adenomas 08/18/2018 3 diminutive polyps removed 2 ssp's and 1 adenoma recall 2024 [name], MD, [facility]    . Hypogonadism in male 01/20/2010    Symptoms: erectile dysfunction, mood Topical did not improve levels Rx injections were not used- can get injections through work    . BPH with urinary obstruction 01/20/2010    Weaker stream. No  nocturia    . Hyperlipidemia 04/25/2007    Simvastatin stopped late 2015- he is not impressed by primary prevention mixed data Worried about diabetes and relation to ED so stopped. Works in Doctor, hospital.      Social History   Occupational History  . Not on file  Tobacco Use  . Smoking status: Former Smoker    Types: Cigarettes, Pipe  . Smokeless tobacco: Never Used  . Tobacco comment: Quit 45 years ago  Substance and Sexual Activity  . Alcohol use: Yes    Alcohol/week: 2.0 - 3.0 standard drinks    Types: 2 - 3 Cans of beer per week  . Drug use: No  . Sexual activity: Yes   Social History   Social History Narrative   Married (wife patient of Dr. Yong Channel), 2 children (18 son going to [place] and 31 son in 2016), no grandkids.       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 and No scleral icterus.  Alert & appropriately interactive. and Not depressed or anxious appearing.  Warm and well perfused   Left third finger is slightly discolored but overall well aligned with swelling at the PIP joint.  He does have a small amount of skin excoriation over the third and fourth proximal phalanxes without significant open skin.  There is swelling distal to this on the third finger and directly over the MCP.  Slight discoloration but no obvious ecchymosis.  He has good capillary refill.  Sensation is intact light touch.  He has full strength with flexion of the MCP, PIP and DIP.   ASSESSMENT:   1. Pain of left middle  finger   2. Contusion of left middle finger without damage to nail, initial encounter      PROCEDURES:  None  PLAN:  Pertinent additional documentation may be included in corresponding procedure notes, imaging studies, problem based documentation and patient instructions.  No problem-specific Assessment & Plan notes found for this encounter.  Overall this is reflective more so of a finger contusion.  No evidence of fracture on x-ray.  Buddy taping recommended as well as cool water soaking and over-the-counter anti-inflammatories as needed.      Activity modifications and the importance of avoiding exacerbating activities (limiting pain to no more than a 4 / 10 during or following activity) recommended and discussed.   Discussed red flag symptoms that warrant earlier emergent evaluation and patient voices understanding.    No orders of the defined types were placed in this encounter. Lab Orders  No laboratory test(s) ordered today   Imaging Orders  DG Finger Middle Left  Referral Orders  No referral(s) requested today    At follow up will plan to consider : repeat X-rays of The left third finger if any persistent significant symptoms or worsening swelling.  Low possibility of occult fracture discussed with him today.  Return in about 3 weeks (around 01/24/2019) for repeat clinical exam.          Gerda Diss, [facility] Physician

## 2019-05-09 ENCOUNTER — Other Ambulatory Visit: Payer: Self-pay

## 2019-05-09 ENCOUNTER — Encounter: Payer: Self-pay | Admitting: Family Medicine

## 2019-05-09 ENCOUNTER — Ambulatory Visit (INDEPENDENT_AMBULATORY_CARE_PROVIDER_SITE_OTHER): Payer: BC Managed Care – PPO | Admitting: Family Medicine

## 2019-05-09 VITALS — BP 120/82 | HR 64 | Temp 98.0°F | Resp 16 | Ht 71.0 in | Wt 245.6 lb

## 2019-05-09 DIAGNOSIS — H6123 Impacted cerumen, bilateral: Secondary | ICD-10-CM

## 2019-05-09 NOTE — Progress Notes (Signed)
   Subjective  CC:  Chief Complaint  Patient presents with  . Earwax removal    Right ear, feeling full muffled hearing    HPI: Derek Pennington is a 63 y.o. male who presents to the office today to address the problems listed above in the chief complaint.  Wax build up in both ears. Left ear was clogged over the weekend. Now improved but still with muffled hearing due to it. No pain, f/c/s or uri sxs. This is a recurrent problem managed by ear irrigation.   Assessment  1. Bilateral impacted cerumen      Plan   Resolved cerumen impaction with irrigation   Follow up: Return if symptoms worsen or fail to improve.  Visit date not found  No orders of the defined types were placed in this encounter.  No orders of the defined types were placed in this encounter.     I reviewed the patients updated PMH, FH, and SocHx.    Patient Active Problem List   Diagnosis Date Noted  . Rectal bleeding 09/01/2015  . Former smoker 02/19/2015  . 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 Personal history of colonic adenomas 06/07/2013  . Hypogonadism in male 01/20/2010  . BPH with urinary obstruction 01/20/2010  . Hyperlipidemia 04/25/2007   Current Meds  Medication Sig  . rosuvastatin (CRESTOR) 5 MG tablet Take 1 tablet (5 mg total) by mouth daily.  . tadalafil (ADCIRCA/CIALIS) 20 MG tablet Take 1 tablet by mouth every other day as needed for Erectile Dysfunction    Allergies: Patient has No Known Allergies. Family History: Patient family history includes Aneurysm in his father; Heart attack in his father; Heart disease in his mother; Leukemia in his mother. Social History:  Patient  reports that he has quit smoking. His smoking use included cigarettes and pipe. He has never used smokeless tobacco. He reports current alcohol use of about 2.0 - 3.0 standard drinks of alcohol per week. He reports that he does not use drugs.  Review of Systems: Constitutional: Negative for fever  malaise or anorexia Cardiovascular: negative for chest pain Respiratory: negative for SOB or persistent cough Gastrointestinal: negative for abdominal pain  Objective  Vitals: BP 120/82   Pulse 64   Temp 98 F (36.7 C) (Oral)   Resp 16   Ht 5\' 11"  (1.803 m)   Wt 245 lb 9.6 oz (111.4 kg)   SpO2 96%   BMI 34.25 kg/m  General: no acute distress , A&Ox3 HEENT: PEERL, conjunctiva normal, Oropharynx moist,neck is supple, bilateral cerumen impaction; post irrigation CMA, TMs are normal. Pt feels fine, hearing restored to baseline      Commons side effects, risks, benefits, and alternatives for medications and treatment plan prescribed today were discussed, and the patient expressed understanding of the given instructions. Patient is instructed to call or message via MyChart if he/she has any questions or concerns regarding our treatment plan. No barriers to understanding were identified. We discussed Red Flag symptoms and signs in detail. Patient expressed understanding regarding what to do in case of urgent or emergency type symptoms.   Medication list was reconciled, printed and provided to the patient in AVS. Patient instructions and summary information was reviewed with the patient as documented in the AVS. This note was prepared with assistance of Dragon voice recognition software. Occasional wrong-word or sound-a-like substitutions may have occurred due to the inherent limitations of voice recognition software

## 2019-06-08 ENCOUNTER — Telehealth: Payer: Self-pay

## 2019-06-08 NOTE — Telephone Encounter (Signed)
Attempted to call pt and schedule in office visit, pt didn't answer.

## 2019-10-24 ENCOUNTER — Encounter: Payer: Self-pay | Admitting: Family Medicine

## 2019-10-29 ENCOUNTER — Encounter: Payer: Self-pay | Admitting: Family Medicine

## 2019-11-25 ENCOUNTER — Other Ambulatory Visit: Payer: Self-pay | Admitting: Family Medicine

## 2020-01-30 ENCOUNTER — Encounter: Payer: Self-pay | Admitting: Family Medicine

## 2020-01-30 DIAGNOSIS — I209 Angina pectoris, unspecified: Secondary | ICD-10-CM | POA: Diagnosis not present

## 2020-01-31 ENCOUNTER — Other Ambulatory Visit: Payer: Self-pay

## 2020-01-31 ENCOUNTER — Ambulatory Visit (INDEPENDENT_AMBULATORY_CARE_PROVIDER_SITE_OTHER): Payer: BC Managed Care – PPO | Admitting: Family Medicine

## 2020-01-31 ENCOUNTER — Encounter: Payer: Self-pay | Admitting: Family Medicine

## 2020-01-31 ENCOUNTER — Ambulatory Visit (INDEPENDENT_AMBULATORY_CARE_PROVIDER_SITE_OTHER): Payer: BC Managed Care – PPO

## 2020-01-31 ENCOUNTER — Ambulatory Visit: Payer: BC Managed Care – PPO | Admitting: Physician Assistant

## 2020-01-31 VITALS — BP 108/80 | HR 60 | Temp 98.0°F | Ht 71.0 in | Wt 245.0 lb

## 2020-01-31 DIAGNOSIS — E785 Hyperlipidemia, unspecified: Secondary | ICD-10-CM | POA: Diagnosis not present

## 2020-01-31 DIAGNOSIS — R079 Chest pain, unspecified: Secondary | ICD-10-CM

## 2020-01-31 DIAGNOSIS — R05 Cough: Secondary | ICD-10-CM | POA: Diagnosis not present

## 2020-01-31 DIAGNOSIS — Z8249 Family history of ischemic heart disease and other diseases of the circulatory system: Secondary | ICD-10-CM | POA: Diagnosis not present

## 2020-01-31 LAB — CBC WITH DIFFERENTIAL/PLATELET
Basophils Absolute: 0.1 10*3/uL (ref 0.0–0.1)
Basophils Relative: 1.8 % (ref 0.0–3.0)
Eosinophils Absolute: 0.4 10*3/uL (ref 0.0–0.7)
Eosinophils Relative: 5 % (ref 0.0–5.0)
HCT: 46.1 % (ref 39.0–52.0)
Hemoglobin: 15.9 g/dL (ref 13.0–17.0)
Lymphocytes Relative: 29.8 % (ref 12.0–46.0)
Lymphs Abs: 2.1 10*3/uL (ref 0.7–4.0)
MCHC: 34.5 g/dL (ref 30.0–36.0)
MCV: 85.7 fl (ref 78.0–100.0)
Monocytes Absolute: 0.6 10*3/uL (ref 0.1–1.0)
Monocytes Relative: 9 % (ref 3.0–12.0)
Neutro Abs: 3.8 10*3/uL (ref 1.4–7.7)
Neutrophils Relative %: 54.4 % (ref 43.0–77.0)
Platelets: 208 10*3/uL (ref 150.0–400.0)
RBC: 5.38 Mil/uL (ref 4.22–5.81)
RDW: 13 % (ref 11.5–15.5)
WBC: 7.1 10*3/uL (ref 4.0–10.5)

## 2020-01-31 LAB — LIPID PANEL
Cholesterol: 201 mg/dL — ABNORMAL HIGH (ref 0–200)
HDL: 42.1 mg/dL (ref >39.00)
LDL Cholesterol: 132 mg/dL — ABNORMAL HIGH (ref 0–99)
NonHDL: 159.34
Total CHOL/HDL Ratio: 5
Triglycerides: 136 mg/dL (ref 0.0–149.0)
VLDL: 27.2 mg/dL (ref 0.0–40.0)

## 2020-01-31 LAB — COMPREHENSIVE METABOLIC PANEL
ALT: 20 U/L (ref 0–53)
AST: 16 U/L (ref 0–37)
Albumin: 4.1 g/dL (ref 3.5–5.2)
Alkaline Phosphatase: 65 U/L (ref 39–117)
BUN: 21 mg/dL (ref 6–23)
CO2: 25 mEq/L (ref 19–32)
Calcium: 9.4 mg/dL (ref 8.4–10.5)
Chloride: 104 mEq/L (ref 96–112)
Creatinine, Ser: 1.09 mg/dL (ref 0.40–1.50)
GFR: 68.08 mL/min (ref >60.00)
Glucose, Bld: 94 mg/dL (ref 70–99)
Potassium: 4.2 mEq/L (ref 3.5–5.1)
Sodium: 136 mEq/L (ref 135–145)
Total Bilirubin: 0.7 mg/dL (ref 0.2–1.2)
Total Protein: 7.2 g/dL (ref 6.0–8.3)

## 2020-01-31 LAB — TROPONIN I (HIGH SENSITIVITY): High Sens Troponin I: 4 ng/L (ref 2–17)

## 2020-01-31 LAB — TSH: TSH: 2.34 u[IU]/mL (ref 0.35–4.50)

## 2020-01-31 MED ORDER — ROSUVASTATIN CALCIUM 20 MG PO TABS: 20.0000 mg | ORAL_TABLET | Freq: Every day | ORAL | 3 refills | Status: DC

## 2020-01-31 NOTE — Assessment & Plan Note (Signed)
S: compliant with rosuvastatin 5 mg. Lab Results  Component Value Date   CHOL 172 07/25/2018   HDL 46.30 07/25/2018   LDLCALC 102 (H) 07/25/2018   LDLDIRECT 164.0 12/29/2016   TRIG 117.0 07/25/2018   CHOLHDL 4 07/25/2018   A/P: Poor control last check with LDL over 70 and ideally under 70 since I am concerned about coronary artery disease-increase to 20 mg at this time

## 2020-01-31 NOTE — Patient Instructions (Addendum)
We need labs before you leave- if troponin high will need to send you to hospital.   Team please set up urgent cardiology referral- today or tomorrow if possible  If you have recurrence of chest pain or shortness of breath- go to emergency room/call 911  Keep your phone handy- if #s are high on troponin will send to ER/ED  Continue aspirin once a day. Continue rosuvastatin at 20mg  for now.   Pick up nitroglycerin as well that urgent care sent in - if chest pain recurs take that and call 911

## 2020-01-31 NOTE — Progress Notes (Addendum)
Phone [phone] In person visit   Subjective:   Derek Pennington is a 64 y.o. year old very pleasant male patient who presents for/with See problem oriented charting Chief Complaint  Patient presents with  . Chest Pain    over weekend    This visit occurred during the SARS-CoV-2 public health emergency.  Safety protocols were in place, including screening questions prior to the visit, additional usage of staff PPE, and extensive cleaning of exam room while observing appropriate contact time as indicated for disinfecting solutions.   Past Medical History-  Patient Active Problem List   Diagnosis Date Noted  . BPH with urinary obstruction 01/20/2010    Priority: Medium  . Hyperlipidemia 04/25/2007    Priority: Medium  . Empyema of right pleural space ([place]) 12/11/2013    Priority: Low  . Personal history of colonic adenomas 06/07/2013    Priority: Low  . Hypogonadism in male 01/20/2010    Priority: Low  . Rectal bleeding 09/01/2015    Medications- reviewed and updated Current Outpatient Medications  Medication Sig Dispense Refill  . rosuvastatin (CRESTOR) 5 MG tablet Take 1 tablet (5 mg total) by mouth daily. 90 tablet 3   No current facility-administered medications for this visit.     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 Chest pain, difficulty breathing, clamy skin and one tooth pain. Symptoms subsided once I stopped and rested (took longer for the tooth pain). Of course I'm thinking I was having a coronary. I'm fine now. Would you want me to come in or do you advise a  specialist? If I did have an event, how long do elevated troponin levels remain in the system and is it worth checking? At this age my mother had similar issues and had stints put in and eventually bypass."  On Saturday, Symptoms during 1000 yard walk were chest pain (central tightness in chest with a sprain or someone sitting on it), dyspnea on exertion, clamminess and tooth pain on the left upper jaw (resolved by Monday- he thought he might need a root canal) . Chest pain, lightheadedness and shortness of breath resolved within about 10 minutes- when he resumed walking walked slower. He tried to walk again on Sunday and had chest pain, windedness.   When he reflects back over the winter months and when he would take trash out and hed get some sharpness in his chest with the cold air that he thought was his bronchi. He also feels like with walking he has been more winded than usual.   Mom had stents around this age and eventual bypass. Has not had symptoms on Monday or Tuesday.   Went to urgent care and had EKG last night- (see mychart).   EKG: sinus rhythm with rate 60, normal axis, normal intervals, no hypertrophy, no st or t wave changes (less than 1 box elevation in lateral leads)  Wife [place] Gen[place] and him ar emoving to [place] end of month.   Feels a vague itching sensation in chest- no pain  or shortness of breath at this time.   Took aspirin 81 mg last night and 4 of rosuvastatin 5mg   From 2018 coronary ct which he had done for shortness of breath.  He also in May 2018 had normal exercise tolerance test with Dr. Johnsie Cancel "IMPRESSION: 1) Calcium score 115 67th percentile for age and sex  2) Left dominant coronary arteries with calcific plaque but no hemodynamically significant stenotic lesions  3) Aortic atherosclerosis" A/P: 64 year old male with hyperlipidemia and family history of CAD with new onset exertional chest pain over the weekend.  He has been taking it easy since that time  and has not had recurrence.  He denies chest pain today-feels a vague itchiness diffusely over his chest.  I had him walk quickly in the hallway and he was unable to reproduce the symptoms thankfully. -He is compliant with aspirin 81 mg starting yesterday evening -We will increase rosuvastatin from 5 mg to 20 mg-he only took  One 20 mg dose last night so his lipids today should reflect primarily 5 mg dose -He knows if he has recurrent symptoms to call 911.  Urgent care gave him nitroglycerin and he agrees to take that and call 911 -We will get troponin level and if elevated will send to emergency room -urgent cardiology referral placed-would like to get him in today or tomorrow if possible -Update chest x-ray today as well  Due to urgent referral-we requested patient stay in the building until appointment was scheduled-he left without notifying staff.  We called him on multiple occasions trying to get him set up for his visit with cardiology but have been unable to contact him-over 8 phone calls were made and ultimately left a voicemail   Hyperlipidemia S: compliant with rosuvastatin 5 mg. Lab Results  Component Value Date   CHOL 172 07/25/2018   HDL 46.30 07/25/2018   LDLCALC 102 (H) 07/25/2018   LDLDIRECT 164.0 12/29/2016   TRIG 117.0 07/25/2018   CHOLHDL 4 07/25/2018   A/P: Poor control last check with LDL over 70 and ideally under 70 since I am concerned about coronary artery disease-increase to 20 mg at this time      Recommended follow up: Emergent precautions reviewed multiple times with patient today.  He also knows if he has acute concerns like this in the future to not use my chart-I greatly appreciate his understanding.  Lab/Order associations:   ICD-10-CM   1. Exertional chest pain  R07.9 Ambulatory referral to Cardiology  2. Chest pain, unspecified type  R07.9 Troponin I (High Sensitivity)    DG Chest 2 View  3. Family history of coronary artery disease  Z82.49   4.  Hyperlipidemia, unspecified hyperlipidemia type  E78.5 CBC with Differential/Platelet    Comprehensive metabolic panel    Lipid panel    TSH    Meds ordered this encounter  Medications  . rosuvastatin (CRESTOR) 20 MG tablet    Sig: Take 1 tablet (20 mg total) by mouth daily.    Dispense:  90 tablet    Refill:  3   Return precautions advised.  Garret Reddish, MD

## 2020-02-01 ENCOUNTER — Encounter: Payer: Self-pay | Admitting: Family Medicine

## 2020-02-02 ENCOUNTER — Encounter: Payer: Self-pay | Admitting: Internal Medicine

## 2020-02-02 ENCOUNTER — Other Ambulatory Visit (HOSPITAL_COMMUNITY)
Admission: RE | Admit: 2020-02-02 | Discharge: 2020-02-02 | Disposition: A | Discharge status: Home / Self Care | Payer: BC Managed Care – PPO | Source: Ambulatory Visit | Attending: Internal Medicine | Admitting: Internal Medicine

## 2020-02-02 ENCOUNTER — Ambulatory Visit (INDEPENDENT_AMBULATORY_CARE_PROVIDER_SITE_OTHER): Payer: BC Managed Care – PPO | Admitting: Internal Medicine

## 2020-02-02 ENCOUNTER — Other Ambulatory Visit: Payer: Self-pay

## 2020-02-02 VITALS — BP 112/82 | HR 59 | Ht 71.0 in | Wt 244.8 lb

## 2020-02-02 DIAGNOSIS — Z20822 Contact with and (suspected) exposure to covid-19: Secondary | ICD-10-CM | POA: Insufficient documentation

## 2020-02-02 DIAGNOSIS — E785 Hyperlipidemia, unspecified: Secondary | ICD-10-CM

## 2020-02-02 DIAGNOSIS — Z01812 Encounter for preprocedural laboratory examination: Secondary | ICD-10-CM | POA: Insufficient documentation

## 2020-02-02 DIAGNOSIS — R079 Chest pain, unspecified: Secondary | ICD-10-CM | POA: Diagnosis not present

## 2020-02-02 LAB — SARS CORONAVIRUS 2 (TAT 6-24 HRS): SARS Coronavirus 2: NEGATIVE

## 2020-02-02 NOTE — Progress Notes (Signed)
Cardiology Office Note:    Date:  02/02/2020   ID:  Derek Pennington, DOB 01-Jun-1956, MRN [id number]  PCP:  Marin Olp, MD  Cardiologist:  Elouise Munroe, MD  Electrophysiologist:  None   Referring MD: Marin Olp, MD   Chief Complaint: Chest pain  History of Present Illness:    Derek Pennington is a 64 y.o. male with a history of hyperlipidemia and VATS procedure on the right for empyema, who presents for an episode of exertional chest pain.  He teaches wilderness first-aid and noticed that when he was walking [phone] yards recently he had chest pain with nausea, cold sweats, and left-sided tooth pain.  Pain resolved within about 10 minutes.  He had a repeat episode the following day with chest pain and short windedness.  Given his background knowledge, he was concerned he was having an MI and spoke to his primary care physician who referred him to cardiology.  For several months he has had occasional sharp chest pain with cold air that he thought was related to his lungs more so than his heart.  He does note feeling more winded than usual with exertion.  He has had a prior cardiac work-up for chest pain and had a coronary CTA in April 2018.  He was noted to have a calcium score of 115 and left dominant coronary arteries with mild plaque and no obstructive CAD.  Family history: Mother with stents and bypass at 56.   Past Medical History:  Diagnosis Date  . Acute respiratory failure with hypoxia ([place]) 12/11/2013  . ED (erectile dysfunction)   . Hyperlipidemia   . Personal history of colonic adenomas 06/07/2013   2009 - diminutive polyp removed not recovered 05/2013 2 diminutive adenomas Repeat colon 2019 [name], MD, [facility]   . Testosterone deficiency     Past Surgical History:  Procedure Laterality Date  . COLONOSCOPY  05/2013   [facility]  polyps  . DECORTICATION  12/13/2013   Procedure: DECORTICATION;  Surgeon: Grace Isaac, MD;  Location: [facility];  Service:  Thoracic;;  . FINGER FRACTURE SURGERY     fifth R  . lipoma back  1983  . PLEURAL EFFUSION DRAINAGE Right 12/13/2013   Procedure: DRAINAGE OF PLEURAL EFFUSION;  Surgeon: Grace Isaac, MD;  Location: [facility];  Service: Thoracic;  Laterality: Right;  Marland Kitchen VIDEO ASSISTED THORACOSCOPY Right 12/13/2013   Procedure: VIDEO ASSISTED THORACOSCOPY;  Surgeon: Grace Isaac, MD;  Location: [facility];  Service: Thoracic;  Laterality: Right;  Marland Kitchen VIDEO BRONCHOSCOPY Right 12/13/2013   Procedure: VIDEO BRONCHOSCOPY;  Surgeon: Grace Isaac, MD;  Location: [facility] OR;  Service: Thoracic;  Laterality: Right;    Current Medications: Current Meds  Medication Sig  . aspirin EC 81 MG tablet Take 81 mg by mouth daily.  . nitroGLYCERIN (NITROSTAT) 0.4 MG SL tablet   . rosuvastatin (CRESTOR) 20 MG tablet Take 1 tablet (20 mg total) by mouth daily.     Allergies:   Patient has no known allergies.   Social History   Socioeconomic History  . Marital status: Married    Spouse name: Not on file  . Number of children: Not on file  . Years of education: Not on file  . Highest education level: Not on file  Occupational History  . Not on file  Tobacco Use  . Smoking status: Former Smoker    Types: Cigarettes, Pipe  . Smokeless tobacco: Never Used  . Tobacco comment: Quit 45  years ago  Substance and Sexual Activity  . Alcohol use: Yes    Alcohol/week: 2.0 - 3.0 standard drinks    Types: 2 - 3 Cans of beer per week  . Drug use: No  . Sexual activity: Yes  Other Topics Concern  . Not on file  Social History Narrative   Married (wife patient of Dr. Yong Channel), 2 children (18 son going to [place] and 6 son in 2016), no grandkids.       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 Difficulty of Paying Living Expenses:   Food Insecurity:   . Worried About Charity fundraiser in the Last Year:   . Arboriculturist in the Last Year:   Transportation Needs:   . Film/video editor (Medical):   Marland Kitchen Lack of Transportation (Non-Medical):   Physical Activity:   . Days of Exercise per Week:   . Minutes of Exercise per Session:   Stress:   . Feeling of Stress :   Social Connections:   . Frequency of Communication with Friends and Family:   . Frequency of Social Gatherings with Friends and Family:   . Attends Religious Services:   . Active Member of Clubs or Organizations:   . Attends Archivist Meetings:   Marland Kitchen Marital Status:      Family History: The patient's family history includes Aneurysm in his father; Heart attack in his father; Heart disease in his mother; Leukemia in his mother. There is no history of Colon cancer, Rectal cancer, or Stomach cancer.  ROS:   Please see the history of present illness.    All other systems reviewed and are negative.  EKGs/Labs/Other Studies Reviewed:    The following studies were reviewed today:  EKG: Sinus bradycardia, nonspecific T wave abnormality.  Recent Labs: 01/31/2020: ALT 20; BUN 21; Creatinine, Ser 1.09; Hemoglobin 15.9; Platelets 208.0; Potassium 4.2; Sodium 136; TSH 2.34  Recent Lipid Panel    Component Value Date/Time   CHOL 201 (H) 01/31/2020 0946   CHOL 159 05/04/2017 0750   TRIG 136.0 01/31/2020 0946   HDL 42.10 01/31/2020 0946   HDL 43 05/04/2017 0750   CHOLHDL 5 01/31/2020 0946   VLDL 27.2 01/31/2020 0946   LDLCALC 132 (H) 01/31/2020 0946   LDLCALC 84 05/04/2017 0750   LDLDIRECT 164.0 12/29/2016 1002    Physical Exam:    VS:  BP 112/82   Pulse (!) 59   Ht 5\' 11"  (1.803 m)   Wt 244 lb 12.8 oz (111 kg)   SpO2 96%   BMI 34.14 kg/m     Wt Readings from Last 5 Encounters:  02/12/20 245 lb (111.1 kg)  02/06/20 244 lb (110.7  kg)  02/02/20 244 lb 12.8 oz (111 kg)  01/31/20 245 lb (111.1 kg)  05/09/19 245 lb 9.6 oz (111.4 kg)     Constitutional: No acute distress Eyes: sclera non-icteric, normal conjunctiva and lids ENMT: Mask in place Cardiovascular: regular rhythm, normal rate, no murmurs. S1 and S2 normal. Radial pulses normal bilaterally. No jugular venous distention.  Respiratory: clear to auscultation bilaterally GI : normal bowel sounds, soft and nontender. No distention.  MSK: extremities warm, well perfused. No edema.  NEURO: grossly nonfocal exam, moves all extremities. PSYCH: alert and oriented x 3, normal mood and affect.   ASSESSMENT:    1. Chest pain, exertional   2. Hyperlipidemia, unspecified hyperlipidemia type    PLAN:    Chest pain, exertional - Plan: EKG 12-Lead, MYOCARDIAL PERFUSION IMAGING  Hyperlipidemia, unspecified hyperlipidemia type  His chest pain warrants further evaluation given the typical nature of symptoms.  We will obtain a stress Myoview to further evaluate.  He has hyperlipidemia and lipids are not optimized.  Crestor recently increased from 5 mg to 20 mg.  He has had difficulty with statins in the past due to myalgias, would consider PCSK9 inhibitor if he does not tolerate this increased dose.  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  We will expedite his work-up and determine next best steps.  Total time of encounter: 45 minutes total time of encounter, including 25 minutes spent in face-to-face patient care on the date of this encounter. This time includes coordination of care and counseling regarding above mentioned problem list. Remainder of non-face-to-face time involved reviewing chart documents/testing relevant to the patient encounter and documentation in the medical record. I have independently reviewed documentation from referring provider and records from his prior cardiologist.   Cherlynn Kaiser, MD [facility]    Medication Adjustments/Labs and Tests Ordered: Current medicines are reviewed at length with the patient today.  Concerns regarding medicines are outlined above.  Orders Placed This Encounter  Procedures  . MYOCARDIAL PERFUSION IMAGING  . EKG 12-Lead   No orders of the defined types were placed in this encounter.   Patient Instructions  Medication Instructions:  NO CHANGES  *If you need a refill on your cardiac medications before your next appointment, please call your pharmacy*   Lab Work: NOT NEEDED   Testing/Procedures: WILL BE SCHEDULE AT [facility] Your physician has requested that you have en exercise stress myoview.  Please follow instruction sheet, as given.      Follow-Up: At [facility], you and your health needs are our priority.  As part of our continuing mission to provide you with exceptional heart care, we have created designated Provider Care Teams.  These Care Teams include your primary Cardiologist (physician) and Advanced Practice Providers (APPs -  Physician Assistants and Nurse Practitioners) who all work together to provide you with the care you need, when you need it.    Your next appointment:   2 week(s)  The format for your next appointment:   Virtual Visit   Provider:   Cherlynn Kaiser, MD   Other Instructions N/A

## 2020-02-02 NOTE — Patient Instructions (Addendum)
Medication Instructions:  NO CHANGES  *If you need a refill on your cardiac medications before your next appointment, please call your pharmacy*   Lab Work: NOT NEEDED   Testing/Procedures: WILL BE SCHEDULE AT [facility] Your physician has requested that you have en exercise stress myoview.  Please follow instruction sheet, as given.      Follow-Up: At [facility], you and your health needs are our priority.  As part of our continuing mission to provide you with exceptional heart care, we have created designated Provider Care Teams.  These Care Teams include your primary Cardiologist (physician) and Advanced Practice Providers (APPs -  Physician Assistants and Nurse Practitioners) who all work together to provide you with the care you need, when you need it.    Your next appointment:   2 week(s)  The format for your next appointment:   Virtual Visit   Provider:   Cherlynn Kaiser, MD   Other Instructions N/A

## 2020-02-05 ENCOUNTER — Telehealth (HOSPITAL_COMMUNITY): Payer: Self-pay

## 2020-02-05 NOTE — Telephone Encounter (Signed)
Spoke With the patient, detailed instructions were given. Asked to call back with any questions. He stated that he understood and would be here for his test. Bebe Liter EMTP

## 2020-02-06 ENCOUNTER — Other Ambulatory Visit: Payer: Self-pay

## 2020-02-06 ENCOUNTER — Ambulatory Visit (HOSPITAL_COMMUNITY): Payer: BC Managed Care – PPO | Attending: Cardiology

## 2020-02-06 DIAGNOSIS — R079 Chest pain, unspecified: Secondary | ICD-10-CM | POA: Insufficient documentation

## 2020-02-06 LAB — MYOCARDIAL PERFUSION IMAGING
Estimated workload: 8.5 METS
Exercise duration (min): 7 min
LV dias vol: 97 mL (ref 62–150)
LV sys vol: 37 mL
MPHR: 156 {beats}/min
Peak HR: 141 {beats}/min
Percent HR: 90 %
RPE: 19
Rest HR: 59 {beats}/min
SDS: 0
SRS: 0
SSS: 0
TID: 0.99

## 2020-02-06 MED ORDER — TECHNETIUM TC 99M TETROFOSMIN IV KIT
31.2000 | PACK | Freq: Once | INTRAVENOUS | Status: AC | PRN
  Administered 2020-02-06: 31.2 via INTRAVENOUS
  Filled 2020-02-06: qty 32

## 2020-02-06 MED ORDER — TECHNETIUM TC 99M TETROFOSMIN IV KIT
10.5000 | PACK | Freq: Once | INTRAVENOUS | Status: AC | PRN
  Administered 2020-02-06: 10.5 via INTRAVENOUS
  Filled 2020-02-06: qty 11

## 2020-02-12 ENCOUNTER — Telehealth (INDEPENDENT_AMBULATORY_CARE_PROVIDER_SITE_OTHER): Payer: BC Managed Care – PPO | Admitting: Internal Medicine

## 2020-02-12 ENCOUNTER — Encounter: Payer: Self-pay | Admitting: Internal Medicine

## 2020-02-12 VITALS — Ht 71.0 in | Wt 245.0 lb

## 2020-02-12 DIAGNOSIS — I10 Essential (primary) hypertension: Secondary | ICD-10-CM

## 2020-02-12 DIAGNOSIS — E785 Hyperlipidemia, unspecified: Secondary | ICD-10-CM | POA: Diagnosis not present

## 2020-02-12 DIAGNOSIS — R079 Chest pain, unspecified: Secondary | ICD-10-CM | POA: Diagnosis not present

## 2020-02-12 MED ORDER — AMLODIPINE BESYLATE 2.5 MG PO TABS: 2.5000 mg | ORAL_TABLET | Freq: Every day | ORAL | 3 refills | Status: DC

## 2020-02-12 NOTE — Progress Notes (Signed)
Virtual Visit via Telephone Note   This visit type was conducted due to national recommendations for restrictions regarding the COVID-19 Pandemic (e.g. social distancing) in an effort to limit this patient's exposure and mitigate transmission in our community.  Due to his co-morbid illnesses, this patient is at least at moderate risk for complications without adequate follow up.  This format is felt to be most appropriate for this patient at this time.  The patient did not have access to video technology/had technical difficulties with video requiring transitioning to audio format only (telephone).  All issues noted in this document were discussed and addressed.  No physical exam could be performed with this format.  Please refer to the patient's chart for his  consent to telehealth for [facility].   The patient was identified using 2 identifiers.  Date:  02/12/2020   ID:  Derek Pennington, DOB [birth year], MRN [id number]  Patient Location: Home Provider Location: Home  PCP:  Marin Olp, MD  Cardiologist:  Elouise Munroe, MD  Electrophysiologist:  None   Evaluation Performed:  Follow-Up Visit  Chief Complaint:  F/u chest pain  History of Present Illness:    Derek Pennington is a 64 y.o. male with history of hyperlipidemia and VATS procedure on the right for empyema who presents follow up of chest pain.   He is in the process of moving and notes that when he lifts a heavy box he will have shortness of breath.  We discussed the results of exercise Myoview which showed no ischemia on perfusion imaging but he had a hypertensive response to exercise.  We discussed that this may be contributing to symptoms of shortness of breath and chest pain with exertion.  The patient does not have symptoms concerning for COVID-19 infection (fever, chills, cough, or new shortness of breath).    Past Medical History:  Diagnosis Date  . Acute respiratory failure with hypoxia ([place]) 12/11/2013  .  ED (erectile dysfunction)   . Hyperlipidemia   . Personal history of colonic adenomas 06/07/2013   2009 - diminutive polyp removed not recovered 05/2013 2 diminutive adenomas Repeat colon 2019 [name], MD, [facility]   . Testosterone deficiency    Past Surgical History:  Procedure Laterality Date  . COLONOSCOPY  05/2013   [place]  polyps  . DECORTICATION  12/13/2013   Procedure: DECORTICATION;  Surgeon: Grace Isaac, MD;  Location: [place];  Service: Thoracic;;  . FINGER FRACTURE SURGERY     fifth R  . lipoma back  1983  . PLEURAL EFFUSION DRAINAGE Right 12/13/2013   Procedure: DRAINAGE OF PLEURAL EFFUSION;  Surgeon: Grace Isaac, MD;  Location: [place];  Service: Thoracic;  Laterality: Right;  Marland Kitchen VIDEO ASSISTED THORACOSCOPY Right 12/13/2013   Procedure: VIDEO ASSISTED THORACOSCOPY;  Surgeon: Grace Isaac, MD;  Location: [place];  Service: Thoracic;  Laterality: Right;  Marland Kitchen VIDEO BRONCHOSCOPY Right 12/13/2013   Procedure: VIDEO BRONCHOSCOPY;  Surgeon: Grace Isaac, MD;  Location: [facility];  Service: Thoracic;  Laterality: Right;     Current Meds  Medication Sig  . aspirin EC 81 MG tablet Take 81 mg by mouth daily.  . nitroGLYCERIN (NITROSTAT) 0.4 MG SL tablet   . rosuvastatin (CRESTOR) 20 MG tablet Take 1 tablet (20 mg total) by mouth daily.     Allergies:   Patient has no known allergies.   Social History   Tobacco Use  . Smoking status: Former Smoker    Types: Cigarettes,  Pipe  . Smokeless tobacco: Never Used  . Tobacco comment: Quit 45 years ago  Substance Use Topics  . Alcohol use: Yes    Alcohol/week: 2.0 - 3.0 standard drinks    Types: 2 - 3 Cans of beer per week  . Drug use: No     Family Hx: The patient's family history includes Aneurysm in his father; Heart attack in his father; Heart disease in his mother; Leukemia in his mother. There is no history of Colon cancer, Rectal cancer, or Stomach cancer.  ROS:   Please see the history of present illness.       All other systems reviewed and are negative.   Prior CV studies:   The following studies were reviewed today:  Stress myoveiw 02/06/20  Labs/Other Tests and Data Reviewed:    EKG:  No ECG reviewed.  Recent Labs: 01/31/2020: ALT 20; BUN 21; Creatinine, Ser 1.09; Hemoglobin 15.9; Platelets 208.0; Potassium 4.2; Sodium 136; TSH 2.34   Recent Lipid Panel Lab Results  Component Value Date/Time   CHOL 201 (H) 01/31/2020 09:46 AM   CHOL 159 05/04/2017 07:50 AM   TRIG 136.0 01/31/2020 09:46 AM   HDL 42.10 01/31/2020 09:46 AM   HDL 43 05/04/2017 07:50 AM   CHOLHDL 5 01/31/2020 09:46 AM   LDLCALC 132 (H) 01/31/2020 09:46 AM   LDLCALC 84 05/04/2017 07:50 AM   LDLDIRECT 164.0 12/29/2016 10:02 AM    Wt Readings from Last 3 Encounters:  02/12/20 245 lb (111.1 kg)  02/06/20 244 lb (110.7 kg)  02/02/20 244 lb 12.8 oz (111 kg)     Objective:    Vital Signs:  Ht 5\' 11"  (1.803 m)   Wt 245 lb (111.1 kg)   BMI 34.17 kg/m    VITAL SIGNS:  reviewed GEN:  no acute distress RESPIRATORY:  normal respiratory effort, no increased work of breathing NEURO:  alert and oriented x 3, speech normal PSYCH:  normal affect   ASSESSMENT & PLAN:    Chest pain, exertional Hypertensive response to exercise - Plan: amLODipine (NORVASC) 2.5 MG tablet  He had a nonischemic nuclear stress test but had exercise hypertension.  He had average exercise capacity exercising for 7 minutes and 8-1/2 minutes.  Test was stopped due to fatigue and atypical chest pain.  I am suspicious that his symptoms may be coming from hypertension with activity.  We will try amlodipine 2.5 mg daily.  The patient is moving this weekend to [place].  He will need to establish care with a primary care physician who can titrate his amlodipine further if he tolerates it well.  He can also seek out a cardiologist and we would be happy to forward any documentation as needed.  Hyperlipidemia, unspecified hyperlipidemia  type-continue Crestor 20 mg daily   COVID-19 Education: The signs and symptoms of COVID-19 were discussed with the patient and how to seek care for testing (follow up with PCP or arrange E-visit).  The importance of social distancing was discussed today.  Time:   Today, I have spent 20 minutes with the patient with telehealth technology discussing the above problems.     Medication Adjustments/Labs and Tests Ordered: Current medicines are reviewed at length with the patient today.  Concerns regarding medicines are outlined above.   Tests Ordered: No orders of the defined types were placed in this encounter.   Medication Changes: No orders of the defined types were placed in this encounter.   Follow Up:  prn   Signed, Nadean Corwin  Stann Mainland, MD  02/12/2020 8:44 AM    [facility]

## 2020-02-12 NOTE — Patient Instructions (Addendum)
Medication Instructions:  Start Amlodipine 2.5mg  daily for exercise hypertension *If you need a refill on your cardiac medications before your next appointment, please call your pharmacy*  Lab Work: None ordered this visit  Testing/Procedures: None ordered this visit  Follow-Up: At [facility], you and your health needs are our priority.  As part of our continuing mission to provide you with exceptional heart care, we have created designated Provider Care Teams.  These Care Teams include your primary Cardiologist (physician) and Advanced Practice Providers (APPs -  Physician Assistants and Nurse Practitioners) who all work together to provide you with the care you need, when you need it.  Your next appointment:   Follow up as needed

## 2020-02-23 DIAGNOSIS — M79671 Pain in right foot: Secondary | ICD-10-CM | POA: Diagnosis not present

## 2020-02-26 DIAGNOSIS — M10071 Idiopathic gout, right ankle and foot: Secondary | ICD-10-CM | POA: Diagnosis not present

## 2020-02-26 DIAGNOSIS — M79674 Pain in right toe(s): Secondary | ICD-10-CM | POA: Diagnosis not present

## 2020-02-26 DIAGNOSIS — M79671 Pain in right foot: Secondary | ICD-10-CM | POA: Diagnosis not present

## 2020-05-24 ENCOUNTER — Encounter: Payer: Self-pay | Admitting: Family Medicine

## 2020-05-24 ENCOUNTER — Other Ambulatory Visit: Payer: Self-pay

## 2020-05-24 DIAGNOSIS — I10 Essential (primary) hypertension: Secondary | ICD-10-CM

## 2020-05-24 MED ORDER — ROSUVASTATIN CALCIUM 20 MG PO TABS: 20.0000 mg | ORAL_TABLET | Freq: Every day | ORAL | 0 refills | Status: AC

## 2020-05-24 MED ORDER — AMLODIPINE BESYLATE 2.5 MG PO TABS: 2.5000 mg | ORAL_TABLET | Freq: Every day | ORAL | 0 refills | Status: AC

## 2020-06-13 DIAGNOSIS — N400 Enlarged prostate without lower urinary tract symptoms: Secondary | ICD-10-CM | POA: Diagnosis not present

## 2020-06-13 DIAGNOSIS — K4090 Unilateral inguinal hernia, without obstruction or gangrene, not specified as recurrent: Secondary | ICD-10-CM | POA: Diagnosis not present

## 2020-06-13 DIAGNOSIS — N3289 Other specified disorders of bladder: Secondary | ICD-10-CM | POA: Diagnosis not present

## 2020-06-13 DIAGNOSIS — K769 Liver disease, unspecified: Secondary | ICD-10-CM | POA: Diagnosis not present

## 2020-06-13 DIAGNOSIS — R1031 Right lower quadrant pain: Secondary | ICD-10-CM | POA: Diagnosis not present

## 2020-06-25 DIAGNOSIS — N529 Male erectile dysfunction, unspecified: Secondary | ICD-10-CM | POA: Diagnosis not present

## 2020-06-25 DIAGNOSIS — I10 Essential (primary) hypertension: Secondary | ICD-10-CM | POA: Diagnosis not present

## 2020-06-25 DIAGNOSIS — E785 Hyperlipidemia, unspecified: Secondary | ICD-10-CM | POA: Diagnosis not present

## 2020-06-25 DIAGNOSIS — N401 Enlarged prostate with lower urinary tract symptoms: Secondary | ICD-10-CM | POA: Diagnosis not present

## 2020-06-26 DIAGNOSIS — Z23 Encounter for immunization: Secondary | ICD-10-CM | POA: Diagnosis not present

## 2020-06-26 DIAGNOSIS — N529 Male erectile dysfunction, unspecified: Secondary | ICD-10-CM | POA: Diagnosis not present

## 2020-06-26 DIAGNOSIS — E785 Hyperlipidemia, unspecified: Secondary | ICD-10-CM | POA: Diagnosis not present

## 2020-06-26 DIAGNOSIS — N401 Enlarged prostate with lower urinary tract symptoms: Secondary | ICD-10-CM | POA: Diagnosis not present

## 2020-06-26 DIAGNOSIS — I10 Essential (primary) hypertension: Secondary | ICD-10-CM | POA: Diagnosis not present

## 2020-08-08 DIAGNOSIS — N401 Enlarged prostate with lower urinary tract symptoms: Secondary | ICD-10-CM | POA: Diagnosis not present

## 2020-08-08 DIAGNOSIS — N529 Male erectile dysfunction, unspecified: Secondary | ICD-10-CM | POA: Diagnosis not present

## 2020-08-13 DIAGNOSIS — E291 Testicular hypofunction: Secondary | ICD-10-CM | POA: Diagnosis not present

## 2020-08-13 DIAGNOSIS — N529 Male erectile dysfunction, unspecified: Secondary | ICD-10-CM | POA: Diagnosis not present

## 2020-08-13 DIAGNOSIS — R972 Elevated prostate specific antigen [PSA]: Secondary | ICD-10-CM | POA: Diagnosis not present

## 2020-08-13 DIAGNOSIS — N401 Enlarged prostate with lower urinary tract symptoms: Secondary | ICD-10-CM | POA: Diagnosis not present

## 2021-01-31 DIAGNOSIS — N401 Enlarged prostate with lower urinary tract symptoms: Secondary | ICD-10-CM | POA: Diagnosis not present

## 2021-01-31 DIAGNOSIS — I10 Essential (primary) hypertension: Secondary | ICD-10-CM | POA: Diagnosis not present

## 2021-01-31 DIAGNOSIS — E785 Hyperlipidemia, unspecified: Secondary | ICD-10-CM | POA: Diagnosis not present

## 2021-05-29 ENCOUNTER — Encounter (INDEPENDENT_AMBULATORY_CARE_PROVIDER_SITE_OTHER): Payer: Self-pay | Admitting: Family Medicine

## 2021-05-29 ENCOUNTER — Ambulatory Visit (INDEPENDENT_AMBULATORY_CARE_PROVIDER_SITE_OTHER): Admitting: Family Medicine

## 2021-05-29 VITALS — BP 124/86 | HR 80 | Temp 98.0°F | Resp 16 | Ht 70.5 in | Wt 242.0 lb

## 2021-05-29 MED ORDER — AMLODIPINE 2.5 MG OR TABS
ORAL_TABLET | ORAL | Status: DC
Start: 2021-05-27 — End: 2021-08-28

## 2021-05-29 MED ORDER — ROSUVASTATIN CALCIUM 20 MG OR TABS
ORAL_TABLET | ORAL | Status: DC
Start: 2021-05-27 — End: 2021-08-28

## 2021-08-20 LAB — CBC WITH DIFF, BLOOD
Abs Basophils: 80 {cells}/uL (ref 0–200)
Abs Eosinophils: 296 {cells}/uL (ref 15–500)
Abs Lymphs: 2176 {cells}/uL (ref 850–3900)
Abs Monocytes: 576 {cells}/uL (ref 200–950)
Abs Neutrophils: 4872 {cells}/uL (ref 1500–7800)
Basophils: 1 %
Eosinophils: 3.7 %
HCT: 47.4 % (ref 38.5–50.0)
HGB: 15.7 g/dL (ref 13.2–17.1)
Lymps: 27.2 %
MCH: 28.2 pg (ref 27.0–33.0)
MCHC: 33.1 g/dL (ref 32.0–36.0)
MCV: 85.1 fL (ref 80.0–100.0)
MPV: 10.6 fL (ref 7.5–12.5)
Monocytes: 7.2 %
PLT: 214 10*3/uL (ref 140–400)
RBC: 5.57 10*6/uL (ref 4.20–5.80)
RDW: 12.9 % (ref 11.0–15.0)
SEGS: 60.9 %
WBC: 8 10*3/uL (ref 3.8–10.8)

## 2021-08-20 LAB — PSA (SCREEN), BLOOD: PSA, Total: 4.87 ng/mL — ABNORMAL HIGH (ref <=4.00)

## 2021-08-20 LAB — COMPREHENSIVE METABOLIC PANEL, BLOOD
ALT (SGPT): 28 U/L (ref 9–46)
AST (SGOT): 20 U/L (ref 10–35)
Albumin/Glob Ratio: 1.5 (calc) (ref 1.0–2.5)
Albumin: 4.5 g/dL (ref 3.6–5.1)
Alkaline Phos: 69 U/L (ref 35–144)
BUN: 18 mg/dL (ref 7–25)
Bilirubin, Total: 1.3 mg/dL — ABNORMAL HIGH (ref 0.2–1.2)
Calcium: 9.6 mg/dL (ref 8.6–10.3)
Carbon Dioxide: 29 mmol/L (ref 20–32)
Chloride: 101 mmol/L (ref 98–110)
Creatinine: 1.15 mg/dL (ref 0.70–1.35)
EGFR: 71 mL/min/{1.73_m2} (ref >=60)
Globulin: 3.1 g/dL (ref 1.9–3.7)
Glucose: 89 mg/dL (ref 65–99)
Potassium: 4.7 mmol/L (ref 3.5–5.3)
Sodium: 138 mmol/L (ref 135–146)
Total Protein: 7.6 g/dL (ref 6.1–8.1)

## 2021-08-20 LAB — CHOLESTEROL, TOTAL BLOOD: Cholesterol: 164 mg/dL (ref <200)

## 2021-08-20 LAB — TRIGLYCERIDES, BLOOD: Triglycerides: 137 mg/dL (ref <150)

## 2021-08-20 LAB — LDL CHOLESTEROL, DIRECT: LDL-Cholesterol: 88 mg/dL

## 2021-08-20 LAB — TSH W/REFLEX TO FT4-QUEST: TSH: 2.01 m[IU]/L (ref 0.40–4.50)

## 2021-08-20 LAB — NON HDL CHOLESTEROL -QUEST: Non-HDL Cholesterol: 112 mg/dL (ref <130)

## 2021-08-20 LAB — HEPATITIS C AB, BLOOD
Hepatis C AB Signal/Cut Off: 0.15 (ref <1.00)
Hepatitis C Ab: NONREACTIVE

## 2021-08-20 LAB — CHOLESTEROL/HDLC RATIO-QUEST: Chol/HDLC Ratio: 3.2 (calc) (ref <5.0)

## 2021-08-20 LAB — HDL-CHOLESTEROL, BLOOD: HDL Cholesterol: 52 mg/dL (ref >=40)

## 2021-08-26 ENCOUNTER — Ambulatory Visit (INDEPENDENT_AMBULATORY_CARE_PROVIDER_SITE_OTHER): Admitting: Family Medicine

## 2021-08-28 ENCOUNTER — Ambulatory Visit (INDEPENDENT_AMBULATORY_CARE_PROVIDER_SITE_OTHER): Admitting: Family Medicine

## 2021-08-28 ENCOUNTER — Encounter (INDEPENDENT_AMBULATORY_CARE_PROVIDER_SITE_OTHER): Payer: Self-pay | Admitting: Family Medicine

## 2021-08-28 VITALS — BP 122/80 | HR 61 | Temp 96.7°F | Resp 12 | Ht 70.0 in | Wt 243.8 lb

## 2021-08-28 MED ORDER — ZOSTER VAC RECOMB ADJUVANTED 50 MCG/0.5ML IM SUSR
0.5000 mL | Freq: Once | INTRAMUSCULAR | 0 refills | Status: AC
Start: 2021-08-28 — End: 2021-08-28

## 2021-08-28 MED ORDER — AMLODIPINE 2.5 MG OR TABS
ORAL_TABLET | ORAL | 3 refills | Status: DC
Start: 2021-08-28 — End: 2023-05-28

## 2021-08-28 MED ORDER — ROSUVASTATIN CALCIUM 20 MG OR TABS
ORAL_TABLET | ORAL | 3 refills | Status: DC
Start: 2021-08-28 — End: 2021-09-01

## 2021-09-01 MED ORDER — ASPIRIN 81 MG OR TBEC
81.0000 mg | DELAYED_RELEASE_TABLET | Freq: Every day | ORAL | Status: DC
Start: ? — End: 2021-12-16

## 2021-09-01 MED ORDER — NITROGLYCERIN 0.4 MG SL SUBL
0.4000 mg | SUBLINGUAL_TABLET | SUBLINGUAL | Status: DC | PRN
Start: ? — End: 2021-12-17

## 2021-09-05 ENCOUNTER — Encounter (INDEPENDENT_AMBULATORY_CARE_PROVIDER_SITE_OTHER): Payer: Self-pay | Admitting: Hospital

## 2021-09-17 ENCOUNTER — Encounter (INDEPENDENT_AMBULATORY_CARE_PROVIDER_SITE_OTHER): Payer: Self-pay

## 2021-10-02 ENCOUNTER — Encounter (INDEPENDENT_AMBULATORY_CARE_PROVIDER_SITE_OTHER): Payer: Self-pay

## 2021-12-01 ENCOUNTER — Other Ambulatory Visit (INDEPENDENT_AMBULATORY_CARE_PROVIDER_SITE_OTHER): Payer: Self-pay | Admitting: Family Medicine

## 2021-12-01 ENCOUNTER — Encounter (INDEPENDENT_AMBULATORY_CARE_PROVIDER_SITE_OTHER): Payer: Self-pay | Admitting: Family Medicine

## 2021-12-01 MED ORDER — ROSUVASTATIN CALCIUM 20 MG OR TABS
20.0000 mg | ORAL_TABLET | Freq: Every day | ORAL | 0 refills | Status: DC
Start: 2021-12-01 — End: 2022-01-15

## 2021-12-16 ENCOUNTER — Encounter (INDEPENDENT_AMBULATORY_CARE_PROVIDER_SITE_OTHER): Payer: Self-pay | Admitting: Family Medicine

## 2021-12-16 ENCOUNTER — Telehealth: Admitting: Family Medicine

## 2021-12-16 VITALS — BP 130/80 | Ht 70.0 in | Wt 240.0 lb

## 2021-12-16 MED ORDER — PROMETHAZINE-CODEINE 6.25-10 MG/5ML OR SYRP
5.0000 mL | ORAL_SOLUTION | Freq: Four times a day (QID) | ORAL | 0 refills | Status: DC | PRN
Start: 2021-12-16 — End: 2021-12-17

## 2021-12-17 ENCOUNTER — Ambulatory Visit (INDEPENDENT_AMBULATORY_CARE_PROVIDER_SITE_OTHER): Admitting: Family Medicine

## 2021-12-17 ENCOUNTER — Encounter (INDEPENDENT_AMBULATORY_CARE_PROVIDER_SITE_OTHER): Payer: Self-pay | Admitting: Family Medicine

## 2021-12-17 VITALS — BP 122/76 | HR 68 | Temp 98.3°F | Resp 12 | Ht 70.0 in | Wt 238.0 lb

## 2021-12-17 MED ORDER — JUBLIA 10 % EX SOLN
CUTANEOUS | 0 refills | Status: DC
Start: 2021-12-17 — End: 2022-01-09

## 2021-12-17 MED ORDER — DOXYCYCLINE MONOHYDRATE 100 MG OR CAPS
100.0000 mg | ORAL_CAPSULE | Freq: Two times a day (BID) | ORAL | 0 refills | Status: AC
Start: 2021-12-17 — End: 2021-12-24

## 2021-12-17 MED ORDER — PROMETHAZINE-CODEINE 6.25-10 MG/5ML OR SYRP
5.0000 mL | ORAL_SOLUTION | Freq: Four times a day (QID) | ORAL | 0 refills | Status: DC | PRN
Start: 2021-12-17 — End: 2021-12-30

## 2021-12-28 ENCOUNTER — Encounter (INDEPENDENT_AMBULATORY_CARE_PROVIDER_SITE_OTHER): Payer: Self-pay | Admitting: Family Medicine

## 2021-12-30 ENCOUNTER — Telehealth: Admitting: Family

## 2021-12-30 ENCOUNTER — Encounter (INDEPENDENT_AMBULATORY_CARE_PROVIDER_SITE_OTHER): Payer: Self-pay | Admitting: Family

## 2021-12-30 ENCOUNTER — Telehealth (INDEPENDENT_AMBULATORY_CARE_PROVIDER_SITE_OTHER): Admitting: Family Medicine

## 2021-12-30 VITALS — Ht 70.0 in | Wt 240.0 lb

## 2021-12-30 MED ORDER — ALBUTEROL SULFATE 108 (90 BASE) MCG/ACT IN AERS
2.0000 | INHALATION_SPRAY | Freq: Four times a day (QID) | RESPIRATORY_TRACT | 0 refills | Status: DC | PRN
Start: 2021-12-30 — End: 2022-06-10

## 2021-12-30 MED ORDER — METHYLPREDNISOLONE 4 MG OR KIT
ORAL_TABLET | ORAL | 0 refills | Status: DC
Start: 2021-12-30 — End: 2022-01-09

## 2022-01-02 ENCOUNTER — Encounter (INDEPENDENT_AMBULATORY_CARE_PROVIDER_SITE_OTHER): Payer: Self-pay | Admitting: Family

## 2022-01-02 ENCOUNTER — Telehealth (INDEPENDENT_AMBULATORY_CARE_PROVIDER_SITE_OTHER): Admitting: Family Medicine

## 2022-01-02 ENCOUNTER — Encounter (INDEPENDENT_AMBULATORY_CARE_PROVIDER_SITE_OTHER): Payer: Self-pay | Admitting: Family Medicine

## 2022-01-09 ENCOUNTER — Ambulatory Visit (INDEPENDENT_AMBULATORY_CARE_PROVIDER_SITE_OTHER): Admitting: Family Medicine

## 2022-01-09 ENCOUNTER — Encounter (INDEPENDENT_AMBULATORY_CARE_PROVIDER_SITE_OTHER): Payer: Self-pay | Admitting: Family Medicine

## 2022-01-09 VITALS — BP 120/64 | HR 80 | Temp 98.5°F | Resp 17 | Ht 70.0 in | Wt 239.0 lb

## 2022-01-09 MED ORDER — BENZONATATE 100 MG OR CAPS
100.0000 mg | ORAL_CAPSULE | Freq: Three times a day (TID) | ORAL | 0 refills | Status: DC | PRN
Start: 2022-01-09 — End: 2022-06-10

## 2022-01-09 MED ORDER — AZITHROMYCIN 250 MG OR TABS
ORAL_TABLET | ORAL | 0 refills | Status: AC
Start: 2022-01-09 — End: 2022-01-14

## 2022-01-15 ENCOUNTER — Other Ambulatory Visit (INDEPENDENT_AMBULATORY_CARE_PROVIDER_SITE_OTHER): Payer: Self-pay | Admitting: Family Medicine

## 2022-01-15 MED ORDER — ROSUVASTATIN CALCIUM 20 MG OR TABS
ORAL_TABLET | ORAL | 3 refills | Status: DC
Start: 2022-01-15 — End: 2023-02-16

## 2022-06-10 ENCOUNTER — Telehealth (INDEPENDENT_AMBULATORY_CARE_PROVIDER_SITE_OTHER): Admitting: Family Medicine

## 2022-06-10 ENCOUNTER — Encounter (INDEPENDENT_AMBULATORY_CARE_PROVIDER_SITE_OTHER): Payer: Self-pay | Admitting: Family Medicine

## 2022-06-10 VITALS — Ht 70.5 in | Wt 240.0 lb

## 2022-06-10 NOTE — Progress Notes (Signed)
Encounter Date:  06/10/22  1:20 PM   PCP: Arlice Colt  MRN: [id number]  DOB: May 09, 1956    [facility] VIDEO SERVICES ENCOUNTER  Pandemic Response Ambulatory Protocol    Evaluator(s):   [name] is a 66 year old male who was evaluated by: Attending Physician (via telemedicine)    Statement:    A Relevant History (including allergies, medications, past medical history, relevant review of systems) and relevant exam as performed by the named provider, are as transcribed in this and/or the accompanying note. Please also see the patient questionnaire for details.    Patient Verification & Telemedicine Consent:      -I have verified that the patient's identification to be correct via verbal confirmation of birth date and address & valid: Yes  -The patient, and / or surrogate, has been made aware that patient is to be evaluated today using a home telemedicine visit technique (audio, video & digital data transmission): Yes [VIDEO]  - The patient, and / or surrogate, has been made aware that patient has the right to refuse this type of evaluation at any time during the assessment period, and has been made aware of any alternatives to this type of evaluation: Yes  -The patient, and / or surrogate,  has been made aware that patient may need further evaluations in the future: Yes  -The patient, and / or surrogate, has signed a valid Informed Consent document (detailing risks, benefits, alternatives & costs), or is exempt from these requirements by law, which I verify is currently present in the [place] MEDICAL RECORD [id number]        HPI  Dwayne Higgins is a 66 year old male who is here w/c/o right-sided sciatic pain  No previous mention in clinic   No h/o back pain   EST 05/29/21 with Dr. Fleet Contras    When driving car, experiences right lower back pain with sciatica when pressing the brake   When shown an image of dermatomes on camera, patient indicates pain begins on the right at ~S1-S2 and radiates through right  great toe  Onset: x2 months ago  He has been receiving chiropractic treatment twice weekly since then, last tx 8/18  Tx: naproxen at night as needed, acupressure mat with relief of pain  Triggers: long roadtrips, extended hours driving as volunteer sheriff   Denies trauma or injury   Patient finished landscaping his home himself 10/2020    Review of Systems  All systems reviewed and negative except for HPI    Current Outpatient Medications   Medication Sig Dispense Refill    amLODIPINE (NORVASC) 2.5 MG tablet TAKE 1 TAB PO DAILY 90 tablet 3    rosuvastatin (CRESTOR) 20 MG tablet Take 1 tablet by mouth once daily 90 tablet 3     No current facility-administered medications for this visit.     No Known Allergies    Reviewed patients pertinent information related to social history, past medical, past surgical, and family history.    Objective  Vital signs: Ht 5' 10.5" (1.791 m)   Wt 108.9 kg (240 lb)   BMI 33.95 kg/m     Physical Exam  General Impression: Video visit, no physical exam completed. Sounds healthy, alert, no distress, pleasant affect, cooperative, communicative      ICD-10-CM ICD-9-CM    1. Lumbosacral radiculopathy  M54.17 724.4 MRI Lumbar Spine W/O Contrast          Dwayne Higgins was seen today for referral/authorization.  Diagnoses and all orders for this visit:    Lumbosacral radiculopathy  Assessment & Plan:  Active  S1-S2 radicular pain   Patient has failed conservative tx   Urgent lumbosacral MRI for eval, notify patient of results   Continue naproxen as directed   Discussed risks benefits and ASE's of medication with pt who demonstrates understanding  Monitor closely   ER precautions   Follow up if sx's worsen or fail to improve.    Orders:  -     MRI Lumbar Spine W/O Contrast          Spent 20 minutes with patient face-to-face and more than 50% of this time was spent in counseling and coordination of care.    Patient instructions:  See EPIC instructions. Reviewed verbally and/or AVS available via  MYchart for patient.      Barriers to learning assessed: None.    Patient/family verbalizes understanding and is agreeable to above plan.    Patient was evaluated by Dr. Dwana Melena, DO at [facility]      TIME SPENT ON MEDICAL DISCUSSION INCLUDING BEFORE, DURING, AND AFTER VIDEO CONVERSATION: 20 MINUTES  Start time: 1:20 PM  Stop time: 1:40 PM    By signing below, I acknowledge that I have reviewed the above note,  dictated by me and scribed by Dwayne Higgins, for accuracy and edited  where necessary. The note accurately reflects the services provided at this  encounter. We retain the right to modify this information in the event of  errors. Occasional errors in punctuation, grammar and content may occur.    Electronically reviewed and signed by Dwana Melena, D.O.       [facility]  [email or web address]

## 2022-06-10 NOTE — Assessment & Plan Note (Signed)
Active  S1-S2 radicular pain   Patient has failed conservative tx   Urgent lumbosacral MRI for eval, notify patient of results   Continue naproxen as directed   Discussed risks benefits and ASE's of medication with pt who demonstrates understanding  Monitor closely   ER precautions   Follow up if sx's worsen or fail to improve.

## 2022-06-23 ENCOUNTER — Other Ambulatory Visit (INDEPENDENT_AMBULATORY_CARE_PROVIDER_SITE_OTHER): Payer: 59

## 2022-06-29 ENCOUNTER — Telehealth (INDEPENDENT_AMBULATORY_CARE_PROVIDER_SITE_OTHER): Payer: Self-pay | Admitting: Family Medicine

## 2022-06-29 NOTE — Telephone Encounter (Signed)
Please let pt know his lumbar MRI did not note any specific areas of nerve impingement that is causing his symptoms.  Sciatica would remain his diagnosis seeing that the source of his pain is not coming from his spine.  Please offer PT for eval and treatment.    Dr Dwana Melena  Family Medicine Physician  [facility]  [phone]  [email or web address]

## 2022-06-30 NOTE — Telephone Encounter (Signed)
Patient has been notified. Patient is seeing aq chiropractor and wants to hold of from Physical Therapy.

## 2022-07-01 ENCOUNTER — Encounter (INDEPENDENT_AMBULATORY_CARE_PROVIDER_SITE_OTHER): Payer: Self-pay | Admitting: Family Medicine

## 2022-07-01 DIAGNOSIS — Z125 Encounter for screening for malignant neoplasm of prostate: Secondary | ICD-10-CM

## 2022-07-01 DIAGNOSIS — Z1211 Encounter for screening for malignant neoplasm of colon: Secondary | ICD-10-CM

## 2022-07-01 DIAGNOSIS — E7800 Pure hypercholesterolemia, unspecified: Secondary | ICD-10-CM

## 2022-07-01 DIAGNOSIS — I10 Essential (primary) hypertension: Secondary | ICD-10-CM

## 2022-07-01 DIAGNOSIS — R972 Elevated prostate specific antigen [PSA]: Secondary | ICD-10-CM

## 2022-07-01 NOTE — Telephone Encounter (Signed)
From: [name]  To: [name], DO  Sent: 07/01/2022 11:21 AM PDT  Subject: Colorectal Cancer screening    I received a message that It's time for colorectal screen. Can you arrange to send a Cologuard (or other) kit. I have a November appointment already with you so it would be nice to have the results for that meeting.     Might as well add a CBC/hematology panel also before my November appointment.    Also, can I get a referral for a dermatologist appointment. There are some moles that may need removal and pathology.

## 2022-07-01 NOTE — Telephone Encounter (Signed)
OK to order FIT (stool test)  Insurance will not pay for cologuard without having negative FIT prior.  Order labs for physical exam  Lipid  CMP  CBC  PSA    Sincerely,    Dr. Arlice Colt  Family Medicine Physician  [facility]  [phone]  [email or web address]

## 2022-07-20 ENCOUNTER — Telehealth (INDEPENDENT_AMBULATORY_CARE_PROVIDER_SITE_OTHER): Payer: Self-pay | Admitting: Family Medicine

## 2022-07-20 ENCOUNTER — Encounter (INDEPENDENT_AMBULATORY_CARE_PROVIDER_SITE_OTHER): Payer: Self-pay | Admitting: Family Medicine

## 2022-07-20 DIAGNOSIS — M25562 Pain in left knee: Secondary | ICD-10-CM

## 2022-07-20 NOTE — Telephone Encounter (Signed)
Call patient - Injured his left knee  Order Xray of left knee  Setup an appointment within 1-2 weeks to review his Xray and evaluate his knee.    Sincerely,    Dr. [name]  Family Medicine Physician  [facility]  [phone]  [email or web address]

## 2022-07-20 NOTE — Telephone Encounter (Signed)
From: [name]  To: [name], DO  Sent: 07/20/2022 5:04 PM PDT  Subject: Leg paoin after fall    Eight days ago I fell and landed on the edge of a step on my left knee with full weight (between the knee cap and the top of the tibia). After the acute pain stopped in a few minutes I was been able to walk with full weight on the leg with little or no pain. There was some bruising which only hurts if touched.  Today, there is a slight radiating pain down the tibia.  Do you have xray vision or think I should go to the clinic? Or wait a bit longer?  Thanks  Dwayne Higgins

## 2022-07-21 NOTE — Telephone Encounter (Signed)
X-ray ordered patient notified though mychart

## 2022-07-23 NOTE — Result Encounter Note (Signed)
-  No user information entered into Result Notes-

## 2022-08-19 LAB — CBC WITH DIFF, BLOOD
Abs Basophils: 69 cells/uL (ref 0–200)
Abs Eosinophils: 372 cells/uL (ref 15–500)
Abs Lymphs: 2167 cells/uL (ref 850–3900)
Abs Monocytes: 561 cells/uL (ref 200–950)
Abs Neutrophils: 3131 cells/uL (ref 1500–7800)
Basophils: 1.1 %
Eosinophils: 5.9 %
HCT: 44.7 % (ref 38.5–50.0)
HGB: 15.4 g/dL (ref 13.2–17.1)
Lymps: 34.4 %
MCH: 29.3 pg (ref 27.0–33.0)
MCHC: 34.5 g/dL (ref 32.0–36.0)
MCV: 85.1 fL (ref 80.0–100.0)
MPV: 10.5 fL (ref 7.5–12.5)
Monocytes: 8.9 %
PLT: 209 10*3/uL (ref 140–400)
RBC: 5.25 10*6/uL (ref 4.20–5.80)
RDW: 13 % (ref 11.0–15.0)
SEGS: 49.7 %
WBC: 6.3 10*3/uL (ref 3.8–10.8)

## 2022-08-19 LAB — COMPREHENSIVE METABOLIC PANEL, BLOOD
ALT (SGPT): 30 U/L (ref 9–46)
AST (SGOT): 24 U/L (ref 10–35)
Albumin/Glob Ratio: 1.5 (calc) (ref 1.0–2.5)
Albumin: 4.5 g/dL (ref 3.6–5.1)
Alkaline Phos: 65 U/L (ref 35–144)
BUN: 24 mg/dL (ref 7–25)
Bilirubin, Total: 1.3 mg/dL — ABNORMAL HIGH (ref 0.2–1.2)
Calcium: 9.7 mg/dL (ref 8.6–10.3)
Carbon Dioxide: 30 mmol/L (ref 20–32)
Chloride: 103 mmol/L (ref 98–110)
Creatinine: 1 mg/dL (ref 0.70–1.35)
EGFR: 83 mL/min/{1.73_m2} (ref >=60)
Globulin: 3 g/dL (calc) (ref 1.9–3.7)
Glucose: 95 mg/dL (ref 65–99)
Potassium: 4.6 mmol/L (ref 3.5–5.3)
Sodium: 138 mmol/L (ref 135–146)
Total Protein: 7.5 g/dL (ref 6.1–8.1)

## 2022-08-19 LAB — NON HDL CHOLESTEROL -QUEST: Non-HDL Cholesterol: 106 mg/dL (calc) (ref <130)

## 2022-08-19 LAB — LDL CHOLESTEROL, DIRECT: LDL-Cholesterol: 87 mg/dL (calc)

## 2022-08-19 LAB — PSA (SCREEN), BLOOD: PSA, Total: 4.7 ng/mL — ABNORMAL HIGH (ref <=4.00)

## 2022-08-19 LAB — HDL-CHOLESTEROL, BLOOD: HDL Cholesterol: 55 mg/dL (ref >=40)

## 2022-08-19 LAB — TRIGLYCERIDES, BLOOD: Triglycerides: 99 mg/dL (ref <150)

## 2022-08-19 LAB — CHOLESTEROL, TOTAL BLOOD: Cholesterol: 161 mg/dL (ref <200)

## 2022-08-19 LAB — CHOLESTEROL/HDLC RATIO-QUEST: Chol/HDLC Ratio: 2.9 (calc) (ref <5.0)

## 2022-08-26 LAB — STOOL IMMUNOCHEMICAL OCCULT BLOOD

## 2022-08-27 ENCOUNTER — Encounter: Payer: Self-pay | Admitting: Hospital

## 2022-08-28 NOTE — Progress Notes (Unsigned)
[place]   [email or web address]  [email or web address]   Telephone: [phone]      CONCERN     PCP: [name]  MRN: [id number]   DOB: January 20, 1956     08/31/2022    CONCERN:     Dwayne Higgins is a 66 year old male presents No chief complaint on file.    Does patient feel safe at home? YES   Does the patient have hearing complaints or concerns? NO   Does patient take Calcium/Vitamin D supplementation? YES (see med list for supplements taken)  Depression Screening/Mood Disorder: (use PHQ-9 or MMSE as appropriate)    Patient's past medical history, surgical history, family history and social history have been reviewed and are listed in the note as below.    Adult Male Health Maintenance Screening:   Lipids: 08/18/22  GFR: 08/18/22  Glaucoma (screen yearly >40): ***   Colon [place] (DRE >40, Occult blood yearly, Colonoscopy Q 26yr up to 66yo): 08/19/22 - negative FIT  PSA (>50): 08/18/22 - 4.70 (previously 4.87 08/19/21)    Tobacco Cessation: never smoked      Pneumovax 23 (>65): 2015  Prevnar (>65): 2021  Shingrix (>60): 03/05/22, 11/20/21  Influenza: ***   TdaP (q10 yrs): 10/18/22, due     Followed by:    Dr. BRhae Lerner urology   Pt with elevated PSA     Patient was asked the following questions during this visit:    Have you been hospitalized since last visit with u[place] No   Do you have an [place]Directive? No   Have you fallen in the last 12 mo? No   If yes, how many times?   Do you have urine control / incontinence concerns? No    Do you have trouble performing daily activities at home? No    Have you had glaucoma screening in the last year? ***  When?   Have you had a PNA shot since age 66 Yes   Have you had a flu shot this year? ***   Do you feel depressed, sad or hopeless at times? No       Other chronic conditions, if discussed, are documented below in the A/P  Problem List and Family history in the EMR have been reviewed and updated as  appropriate    ASSESSMENT/   PLAN       There are no diagnoses linked to this encounter.      No diagnosis found.        SUPPORTING OBJECTIVE  / SUBJECTIVE     PHYSICAL EXAM:  There were no vitals filed for this visit.  Ht Readings from Last 1 Encounters:   06/10/22 5' 10.5" (1.791 m)     Wt Readings from Last 1 Encounters:   06/10/22 108.9 kg (240 lb)     There is no height or weight on file to calculate BMI.        06/10/2022     1:03 PM 01/09/2022     4:16 PM 12/30/2021     4:22 PM 12/17/2021    11:39 AM 12/16/2021     3:32 PM 08/28/2021     8:31 AM   Date Weight Recorded   Metric 108.863 kg 108.41 kg 108.863 kg 107.956 kg 108.863 kg 110.587 kg   Pounds/Ounces 240 lb 239 lb 240 lb 238 lb 240 lb 243 lb 12.8 oz        Physical Exam  Latest Reference Range & Units 08/18/22 08:05   Sodium 135 - 146 mmol/L 138   Potassium 3.5 - 5.3 mmol/L 4.6   Chloride 98 - 110 mmol/L 103   Carbon Dioxide 20 - 32 mmol/L 30   BUN 7 - 25 mg/dL 24   Creatinine 0.70 - 1.35 mg/dL 1.00   Glucose 65 - 99 mg/dL 95   Calcium 8.6 - 10.3 mg/dL 9.7   Total Protein 6.1 - 8.1 g/dL 7.5   Alkaline Phos 35 - 144 U/L 65   AST (SGOT) 10 - 35 U/L 24   ALT (SGPT) 9 - 46 U/L 30   Albumin 3.6 - 5.1 g/dL 4.5   Bilirubin, Total 0.2 - 1.2 mg/dL 1.3 (H)   BUN/Creatinine Ratio 6 - 22 (calc) SEE NOTE:   Cholesterol <200 mg/dL 161   LDL-Cholesterol mg/dL (calc) 87   HDL Cholesterol > OR = 40 mg/dL 55   Chol/HDLC Ratio <5.0 (calc) 2.9   Non-HDL Cholesterol <130 mg/dL (calc) 106   Globulin 1.9 - 3.7 g/dL (calc) 3.0   Albumin/Glob Ratio 1.0 - 2.5 (calc) 1.5   PSA, Total < OR = 4.00 ng/mL 4.70 (H)   Triglycerides <150 mg/dL 99   WBC 3.8 - 10.8 Thousand/uL 6.3   RBC 4.20 - 5.80 Million/uL 5.25   HGB 13.2 - 17.1 g/dL 15.4   HCT 38.5 - 50.0 % 44.7   MCV 80.0 - 100.0 fL 85.1   MCH 27.0 - 33.0 pg 29.3   MCHC 32.0 - 36.0 g/dL 34.5   RDW 11.0 - 15.0 % 13.0   PLT 140 - 400 Thousand/uL 209   MPV 7.5 - 12.5 fL 10.5   SEGS % 49.7   Lymps % 34.4   Monocytes % 8.9   Eosinophils %  5.9   Basophils % 1.1   BANDS % NOT AVAILABLE   Metamyelocytes % NOT AVAILABLE   Myelocytes % NOT AVAILABLE   Promyelocytes % NOT AVAILABLE   Blasts % NOT AVAILABLE   Reactive Lymphs 0 - 10 % NOT AVAILABLE   NRBC 0 /100 WBC NOT AVAILABLE   Abs Neutrophils 1,500 - 7,800 cells/uL 3,131   Abs Lymphs 850 - 3,900 cells/uL 2,167   Abs Monocytes 200 - 950 cells/uL 561   Abs Eosinophils 15 - 500 cells/uL 372   Abs Basophils 0 - 200 cells/uL 69   Abs Band Neutrophils 0 - 750 cells/uL NOT AVAILABLE   Abs Metamyelocytes 0 cells/uL NOT AVAILABLE   Abs Myelocytes 0 cells/uL NOT AVAILABLE   Abs Promyelocytes 0 cells/uL NOT AVAILABLE   Abs Blasts 0 cells/uL NOT AVAILABLE   Abs NRBC 0 cells/uL NOT AVAILABLE   Comments  NOT AVAILABLE   EGFR > OR = 60 mL/min/1.77m 83   (H): Data is abnormally high    ALLERGY / MEDICATIONS / SURGICAL  HISTORY /  SOCIAL HISTORY / IMMUNICATIONS / ROS     ALLERGIES:  No Known Allergies       CURRENT  MEDICATIONS:  No outpatient medications have been marked as taking for the 08/31/22 encounter (Appointment) with MMarijo Sanes DO.          PAST SURGICAL HISTORY:  Past Surgical History:   Procedure Laterality Date    COLONOSCOPY  08/18/2018        SOCIAL HISTORY:  Social History     Socioeconomic History    Marital status: Married   Tobacco Use    Smoking status: Never    Smokeless tobacco:  Never   Substance and Sexual Activity    Alcohol use: Yes    Drug use: Never       Immunization History   Administered Date(s) Administered    (Shingles) Herpes Zoster Recombinant Vaccine [facility]) 11/20/2021, 03/05/2022    COVID-19 Alphonsa Overall) >= 18 Years 02/29/2020    COVID-19 (Pfizer) Purple Cap >= 12 Years 10/22/2020    DT Absorbed Pediatric; IM 11/27/1995    Influenza Vaccine (Egg Allergy) >=6 Months 08/28/2021    Pneumococcal 20 Vaccine (PREVNAR-20) 01/18/2020    Pneumococcal 23 Vaccine (PNEUMOVAX-23) 05/29/2014    Td 10/19/2000, 10/18/2012    Tdap 08/12/2012, 10/18/2012         REVIEW OF SYSTEMS:   (unless  listed below, the ROS  is negative except as listed in HPI)  Review of Systems        By signing below, I acknowledge that I have reviewed the above note, dictated by me and scribed by Alfonse Flavors for accuracy and edited where necessary. The note accurately reflects the services provided at this encounter. We retain the right to modify this information in the event of errors. Occasional errors in punctuation, grammar and content may occur.      Electronically reviewed and signed by [name], DO    [place]  [email or web address]

## 2022-08-31 ENCOUNTER — Ambulatory Visit (INDEPENDENT_AMBULATORY_CARE_PROVIDER_SITE_OTHER): Admitting: Family Medicine

## 2022-08-31 ENCOUNTER — Encounter (INDEPENDENT_AMBULATORY_CARE_PROVIDER_SITE_OTHER): Payer: Self-pay | Admitting: Hospital

## 2022-08-31 VITALS — BP 124/78 | HR 68 | Temp 97.0°F | Resp 18 | Ht 70.0 in | Wt 240.0 lb

## 2022-08-31 NOTE — Assessment & Plan Note (Signed)
Status: stable  Plan: referral to GI for colonoscopy screening

## 2022-08-31 NOTE — Assessment & Plan Note (Signed)
status: stable  plan: Procedure  Ear lavage done, ears clear upon treatment, pt tolerated procedure well.   Follow up as needed

## 2022-08-31 NOTE — Procedures (Signed)
Cerumen Removal with Curette/Lavage      Patient is here for ear wax removal.  Both ears were irrigated with complete removal of the cerumen using irrigation.     Patient tolerated procedure well.   No complications. Ear canal(s) clear.   Patient notices improvement.     Patient instructed to keep ears clean using irrigation, Q-tip use discouraged.      Reviewed and Signed By Brandon Miller, DO

## 2022-08-31 NOTE — Assessment & Plan Note (Signed)
-  The patient is low risk for falls and there are no safety concerns that I am aware of.     -There is no evidence of cognitive impairment detected based on my interactions with this patient nor based on any of the medical records I have seen. Family members have not brought up concerns to me.     -Dietary and exercise habits discussed. I recommended increase intake of fruit and vegetables on a daily basis for optimal health. Exercise as tolerated.     -Increase physical activity was encouraged in order to increase level of fitness, strength and reduce long term health problems. . No community based referrals needed at this time.     -The patients past medical hx, family history, social history have been reviewed as documented above.     -Patient asked about their Advanced Directive. If requested, information has been given to the patient regarding an advanced directive. We will comply with their wishes as stated in their AD when provided to us.     -Patient currently sees these other healthcare providers:   NONE     -Screening exams completed, ordered and expected are as above in the health maintenance section.

## 2022-08-31 NOTE — Assessment & Plan Note (Signed)
Stable  Previously followed by Dr. Verlene Mayer, urology  Continue to monitor with routine labs

## 2022-08-31 NOTE — Assessment & Plan Note (Addendum)
Status: Stable, well controlled  Plan: OK to hold amlodipine 2.5   Recommended DASH diet - avoid salt  Encouraged healthy diet and lifestyle modifications  Continue to check blood pressure at home and bring records to next appt - consider restarting amlodipine with results   Pt's blood pressure will be closely monitored by clinical trial

## 2022-08-31 NOTE — Assessment & Plan Note (Signed)
Status: active  Plan: encouraged pt to reconsider, however pt declined

## 2022-09-21 ENCOUNTER — Encounter (INDEPENDENT_AMBULATORY_CARE_PROVIDER_SITE_OTHER): Payer: Self-pay

## 2022-10-14 ENCOUNTER — Telehealth (INDEPENDENT_AMBULATORY_CARE_PROVIDER_SITE_OTHER): Payer: Self-pay | Admitting: Family Medicine

## 2022-10-14 NOTE — Telephone Encounter (Signed)
Opened in error

## 2022-10-29 ENCOUNTER — Encounter (INDEPENDENT_AMBULATORY_CARE_PROVIDER_SITE_OTHER): Payer: Self-pay

## 2022-11-03 ENCOUNTER — Encounter (INDEPENDENT_AMBULATORY_CARE_PROVIDER_SITE_OTHER): Payer: Self-pay

## 2022-11-04 ENCOUNTER — Encounter (INDEPENDENT_AMBULATORY_CARE_PROVIDER_SITE_OTHER): Payer: Self-pay

## 2023-02-16 ENCOUNTER — Other Ambulatory Visit (INDEPENDENT_AMBULATORY_CARE_PROVIDER_SITE_OTHER): Payer: Self-pay | Admitting: Family Medicine

## 2023-02-16 DIAGNOSIS — E7800 Pure hypercholesterolemia, unspecified: Secondary | ICD-10-CM

## 2023-02-16 MED ORDER — ROSUVASTATIN CALCIUM 20 MG OR TABS
ORAL_TABLET | ORAL | 3 refills | Status: DC
Start: 2023-02-16 — End: 2023-05-28

## 2023-04-08 ENCOUNTER — Telehealth (INDEPENDENT_AMBULATORY_CARE_PROVIDER_SITE_OTHER): Payer: Self-pay | Admitting: Family

## 2023-04-08 ENCOUNTER — Encounter (INDEPENDENT_AMBULATORY_CARE_PROVIDER_SITE_OTHER): Payer: Self-pay | Admitting: Family Medicine

## 2023-04-08 DIAGNOSIS — J029 Acute pharyngitis, unspecified: Secondary | ICD-10-CM

## 2023-04-08 MED ORDER — AMOXICILLIN 500 MG OR CAPS
ORAL_CAPSULE | ORAL | 0 refills | Status: DC
Start: 2023-04-08 — End: 2023-05-28

## 2023-04-08 NOTE — Telephone Encounter (Signed)
Dwayne Higgins [phone] called to get up date states they are leaving early Friday morning (tomorrow) for 3 weeks in [place]. His son came down with a fever and sore throat (picked up from girlfriend) and was prescribed Amoxicillin.   Is it possible to get a script for me in the event I come down with the same. I will not take any unless I come down with the same symptoms. This is a just in case scenario  Please contact patient with up date [phone]

## 2023-04-08 NOTE — Telephone Encounter (Signed)
Amoxicillin ordered.

## 2023-04-08 NOTE — Telephone Encounter (Signed)
error 

## 2023-05-25 ENCOUNTER — Encounter (INDEPENDENT_AMBULATORY_CARE_PROVIDER_SITE_OTHER): Payer: Self-pay | Admitting: Family Medicine

## 2023-05-26 NOTE — Telephone Encounter (Signed)
LVMTCB, to schedule an appointment.

## 2023-05-28 ENCOUNTER — Encounter (INDEPENDENT_AMBULATORY_CARE_PROVIDER_SITE_OTHER): Payer: Self-pay | Admitting: Family Medicine

## 2023-05-28 ENCOUNTER — Ambulatory Visit (INDEPENDENT_AMBULATORY_CARE_PROVIDER_SITE_OTHER): Admitting: Family Medicine

## 2023-05-28 VITALS — BP 124/84 | HR 72 | Temp 97.8°F | Resp 16 | Wt 238.0 lb

## 2023-05-28 NOTE — Assessment & Plan Note (Signed)
Status: Stable, well controlled  Patient currently not on Amlodipine  Recommended DASH diet - avoid salt  Encouraged healthy diet and lifestyle modifications  Continue to check blood pressure at home and bring records to next appt - consider restarting amlodipine with results   Pt's blood pressure will be closely monitored by clinical trial

## 2023-05-28 NOTE — Assessment & Plan Note (Addendum)
Active  Proceed with imaging as ordered  Referred the Patient to Orthopedics  D/w Patient use of Voltaren gel   Consider MRI if Pain persists   Continue to monitor

## 2023-05-28 NOTE — Patient Instructions (Addendum)
Xray of left wrist  [facility]  [address] [place], [place] [address]  Phone: [phone]    [facility]  ORTHOPEDIC SURGERY  [address] / [place], [place] [address] / [phone]  Fax: [phone]    Recommend immunizations    Tdap (Tetanus with whooping cough protection) - get at pharmacy

## 2023-05-28 NOTE — Assessment & Plan Note (Signed)
Stable  Recommended RSV and TDAP vaccines today

## 2023-05-28 NOTE — Progress Notes (Signed)
[place] - [place]-  [place] - [place] - [place]   [email or web address]  [email or web address]   Telephone: [phone]      CONCERN     PCP: Arlice Colt  MRN: [id number]   DOB: 26-Jan-1956     05/28/2023    CONCERN:     Dwayne Higgins is a 67 year old male presents   Chief Complaint   Patient presents with    Musculoskeletal Problem     Lt wrist pain  Pt would like a referral , has been hurting for 3 months , was carrying a lot of boxes and thinks it might have caused the pain , pain stay only in the wrist , no numbness in fingers, pt is wearing personal brace    Medication Refill     Pt wants to review meds     Patient returns to clinic for evaluation of left wrist pain.     Today's HPI:    Left Wrist Pain:  Patient says that he has been having pain in the left wrist. 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 The medial left wrist is painful. Pain radiates into the fingers. He has a wrist brace. He has had pain for 3 months and pain has become worse. He has massaged his wrist. He is not able to grasp/pick up objects. He has not tried Voltaren.   Pain in area of medial wrist. Pain worse when adducting wrist    Hypercholesterolemia and Hypertension:  Patient has been hiking recently. He notes that when he first starts exercise he has pain in his chest, but this improves shortly after start of exercise.     Other:  He was getting muscle pain in the past which is the reason for d/c of statin. Since stopping statin his muscle pain has improved.   He had Colon screen 2023.   He has gotten Shingles and Pneumonia vaccine.     Other chronic conditions, if discussed, are documented below in the A/P  Problem List and Family history in the EMR have been reviewed and updated as appropriate    ASSESSMENT/   PLAN       Dwayne Higgins was seen today for musculoskeletal problem and medication refill.    Diagnoses and all orders for this visit:    Left wrist pain  Assessment &  Plan:  Active  Proceed with imaging as ordered  Referred the Patient to Orthopedics  D/w Patient use of Voltaren gel   Consider MRI if Pain persists   Continue to monitor    Orders:  -     X-Ray Wrist Complete, min 3 views  -     Consult/Referral to Orthopedics    Screening for endocrine, nutritional, metabolic and immunity disorder  Assessment & Plan:  Stable  Proceed with labs for eval    Orders:  -     CMP  -     CBC w/ Diff Lavender  -     Lipid Panel Green Plasma Separator Tube    Hypertension, unspecified type  Assessment & Plan:  Status: Stable, well controlled  Patient currently not on Amlodipine  Recommended DASH diet - avoid salt  Encouraged healthy diet and lifestyle modifications  Continue to check blood pressure at home and bring records to next appt - consider restarting amlodipine with results   Pt's blood pressure will be closely monitored by clinical trial       Need for vaccination  Assessment &  Plan:  Stable  Recommended RSV and TDAP vaccines today      Screening for prostate cancer  Assessment & Plan:  Stable  Proceed with labs for eval    Orders:  -     PSA (Screen), Blood - See Instructions            ICD-10-CM ICD-9-CM    1. Left wrist pain  M25.532 719.43 X-Ray Wrist Complete, min 3 views      Consult/Referral to Orthopedics      2. Screening for endocrine, nutritional, metabolic and immunity disorder  Z13.29 V77.99 CMP    Z13.21  CBC w/ Diff Lavender    Z13.228  Lipid Panel Green Plasma Separator Tube    Z13.0        3. Hypertension, unspecified type  I10 401.9       4. Need for vaccination  Z23 V05.9       5. Screening for prostate cancer  Z12.5 V76.44 PSA (Screen), Blood - See Instructions              SUPPORTING OBJECTIVE  / SUBJECTIVE     PHYSICAL EXAM:   05/28/23  0851   BP: 124/84   Pulse: 72   Temp: 97.8 F (36.6 C)   Resp: 16   SpO2: 95%     Ht Readings from Last 1 Encounters:   08/31/22 5\' 10"  (1.778 m)     Wt Readings from Last 1 Encounters:   05/28/23 108 kg (238 lb)     Body  mass index is 34.15 kg/m.        05/28/2023     8:51 AM 08/31/2022     8:40 AM 06/10/2022     1:03 PM 01/09/2022     4:16 PM 12/30/2021     4:22 PM 12/17/2021    11:39 AM   Date Weight Recorded   Metric 107.956 kg 108.863 kg 108.863 kg 108.41 kg 108.863 kg 107.956 kg   Pounds/Ounces 238 lb 240 lb 240 lb 239 lb 240 lb 238 lb        Physical Exam  Vitals and nursing note reviewed. Exam conducted with a chaperone present Corporate treasurer).   Constitutional:       General: He is not in acute distress.     Appearance: He is well-developed.   HENT:      Head: Normocephalic and atraumatic.   Pulmonary:      Effort: Pulmonary effort is normal.   Musculoskeletal:      Comments: tender over 5th CMC joint, medial wirst. No swelling. no skin changes  Pain with wrist adduction   Skin:     General: Skin is warm and dry.   Neurological:      Mental Status: He is alert and oriented to person, place, and time.   Psychiatric:         Mood and Affect: Mood normal.         Behavior: Behavior normal.       ALLERGY / MEDICATIONS / SURGICAL  HISTORY /  SOCIAL HISTORY / IMMUNICATIONS / ROS     ALLERGIES:  No Known Allergies       CURRENT  MEDICATIONS:  No outpatient medications have been marked as taking for the 05/28/23 encounter (Office Visit) with Arlice Colt, DO.      PAST SURGICAL HISTORY:  Past Surgical History:   Procedure Laterality Date    COLONOSCOPY  08/18/2018      SOCIAL HISTORY:  Social History     Socioeconomic History    Marital status: Married   Tobacco Use    Smoking status: Never    Smokeless tobacco: Never   Substance and Sexual Activity    Alcohol use: Yes    Drug use: Never       Immunization History   Administered Date(s) Administered    (Shingles) Herpes Zoster Recombinant Vaccine Laurita Quint) 11/20/2021, 03/05/2022    COVID-19 [employer] Dibbles) >= 18 Years 02/29/2020    COVID-19 (Pfizer) Purple Cap >= 12 Years 10/22/2020    DT Absorbed Pediatric; IM 11/27/1995    Influenza Vaccine (Egg Allergy) >=6 Months 08/28/2021     Pneumococcal 20 Vaccine (PREVNAR-20) 01/18/2020    Pneumococcal 23 Vaccine (PNEUMOVAX-23) 05/29/2014    Td 10/19/2000, 10/18/2012    Tdap 08/12/2012, 10/18/2012     REVIEW OF SYSTEMS:   (unless listed below, the ROS  is negative except as listed in HPI)  Review of Systems   All other systems reviewed and are negative.    By signing below, I acknowledge that I have reviewed the above note, dictated by me and scribed by Nicholes Stairs for accuracy and edited where necessary. The note accurately reflects the services provided at this encounter. We retain the right to modify this information in the event of errors. Occasional errors in punctuation, grammar and content may occur.      Electronically reviewed and signed by Arlice Colt, DO    [facility]  [email or web address]

## 2023-05-28 NOTE — Assessment & Plan Note (Signed)
Stable  Proceed with labs for eval

## 2023-05-31 ENCOUNTER — Encounter (INDEPENDENT_AMBULATORY_CARE_PROVIDER_SITE_OTHER): Payer: Self-pay | Admitting: Hospital

## 2023-06-05 NOTE — Result Encounter Note (Signed)
-  No user information entered into Result Notes-

## 2023-06-08 ENCOUNTER — Ambulatory Visit (INDEPENDENT_AMBULATORY_CARE_PROVIDER_SITE_OTHER): Admitting: Family

## 2023-06-08 VITALS — BP 110/72 | HR 74 | Temp 97.9°F | Resp 16 | Ht 70.0 in | Wt 241.0 lb

## 2023-06-08 NOTE — Progress Notes (Signed)
SUBJECTIVE:   Follow up for staples removal.   DOI: 06/01/23  Trauma History: Scalp laceration  Clinic or hospital where patient received treatment: [facility]  Complications/Problems: none    OBJECTIVE:   Site(s) of staple: scalp  Number of staple(s): 7  Description of wound: intact, no dehiscence  Dressing: none required    ASSESSMENT/PLAN:  See visit diagnosis and order(s) below.  Wound care instructions: Monitor for signs of infection (fever, pus at wound site, +/- increasing redness)

## 2023-06-08 NOTE — Progress Notes (Signed)
[facility]  [place] - [place]-  [place] - [place] - [place]   [email or web address] and [email or web address]  Telephone: [phone]             Encounter Date:  06/08/23  1:05 PM   PCP: Arlice Colt  MRN: [id number]  DOB: 1956/07/03       HPI:  Dwayne Higgins is a 67 year old male presents   Chief Complaint   Patient presents with    ER F/U     Staple removal from scalp      Scalp Laceration  Patient reports he went to the ER for a laceration on his head and he got seven staples on 06/01/23 in [facility]  States he was cutting palm fronds and one fell onto his head and cut his scalp  Notes he had some headaches the first two days after the incident, but no longer experiences them  Denies dizziness, lightheadedness, or vision changes    ASSESSMENT & PLAN:    Gabriel Rung was seen today for er f/u.    Diagnoses and all orders for this visit:    Laceration of scalp, sequela  Assessment & Plan:  Active  S/p ER visit 06/01/23  S/p staples placement  Staple removal in clinic of 7 staples  Avoid vigorous scrubbing of scalp  Continue to monitor  Follow up as needed            ICD-10-CM ICD-9-CM    1. Laceration of scalp, sequela  S01.01XS 906.0                 PROBLEM  LIST:  Patient Active Problem List   Diagnosis    BPH (benign prostatic hyperplasia)    Hypercholesterolemia    Hypertension, unspecified type    Erectile dysfunction, unspecified erectile dysfunction type    Well adult exam    Elevated PSA    Lumbosacral radiculopathy    Impacted cerumen of both ears    Screen for colon cancer    Left wrist pain    Need for vaccination    Screening for endocrine, nutritional, metabolic and immunity disorder    Screening for prostate cancer    Scalp laceration         PAST MEDICAL HISTORY:  Past Medical History:   Diagnosis Date    Enlarged prostate        PAST SURGICAL HISTORY:  Past Surgical History:   Procedure Laterality Date    COLONOSCOPY  08/18/2018        FAMILY HISTORY:   Family  History   Problem Relation Name Age of Onset    Cholesterol/Lipid Disorder Mother      Obesity Mother      Heart Attack Father           SOCIAL HISTORY:  Social History     Socioeconomic History    Marital status: Married     Spouse name: Not on file    Number of children: Not on file    Years of education: Not on file    Highest education level: Not on file   Occupational History    Not on file   Tobacco Use    Smoking status: Never    Smokeless tobacco: Never   Substance and Sexual Activity    Alcohol use: Yes    Drug use: Never    Sexual activity: Not on file   Other Topics Concern    Not on file  Social History Narrative    Not on file     Social Determinants of Health     Financial Resource Strain: Not on file   Food Insecurity: Not on file   Transportation Needs: Not on file   Physical Activity: Not on file   Stress: Not on file   Social Connections: Not on file   Intimate Partner Violence: Not on file   Housing Stability: Not on file     Social History     Tobacco Use   Smoking Status Never   Smokeless Tobacco Never      Social History     Substance and Sexual Activity   Alcohol Use Yes      Social History     Substance and Sexual Activity   Drug Use Never        Immunization History   Administered Date(s) Administered    (Shingles) Herpes Zoster Recombinant Vaccine Laurita Quint) 11/20/2021, 03/05/2022    COVID-19 [employer]wood Dibbles) >= 18 Years 02/29/2020    COVID-19 [employer]) Purple Cap >= 12 Years 10/22/2020    DT Absorbed Pediatric; IM 11/27/1995    Influenza Vaccine (Egg Allergy) >=6 Months 08/28/2021    Pneumococcal 20 Vaccine (PREVNAR-20) 01/18/2020    Pneumococcal 23 Vaccine (PNEUMOVAX-23) 05/29/2014    Td 10/19/2000, 10/18/2012    Tdap 08/12/2012, 10/18/2012         CURRENT  MEDICATIONS:  No current outpatient medications on file prior to visit.     No current facility-administered medications on file prior to visit.     No outpatient medications have been marked as taking for the 06/08/23 encounter (Office Visit)  with Clearence Ped, NP.        ALLERGIES:    No Known Allergies       REVIEW OF SYSTEMS:  Review of Systems   All other systems reviewed and are negative.             PHYSICAL EXAM:   06/08/23  1421   BP: 110/72   Pulse: 74   Temp: 97.9 F (36.6 C)   Resp: 16   SpO2: 96%     Body mass index is 34.58 kg/m.    Ht Readings from Last 1 Encounters:   06/08/23 5\' 10"  (1.778 m)     Wt Readings from Last 1 Encounters:   06/08/23 109.3 kg (241 lb)         06/08/2023     2:21 PM 05/28/2023     8:51 AM 08/31/2022     8:40 AM 06/10/2022     1:03 PM 01/09/2022     4:16 PM 12/30/2021     4:22 PM   Date Weight Recorded   Metric 109.317 kg 107.956 kg 108.863 kg 108.863 kg 108.41 kg 108.863 kg   Pounds/Ounces 241 lb 238 lb 240 lb 240 lb 239 lb 240 lb            Physical Exam  Vitals and nursing note reviewed.   Constitutional:       Appearance: Normal appearance. He is well-developed and normal weight.      Comments: Laceration visualized on top of head. Scabbing over.   Cardiovascular:      Rate and Rhythm: Normal rate and regular rhythm.      Pulses: Normal pulses.      Heart sounds: Normal heart sounds.   Pulmonary:      Effort: Pulmonary effort is normal.      Breath sounds: Normal breath sounds.  Skin:     General: Skin is warm and dry.   Neurological:      Mental Status: He is alert.   Psychiatric:         Mood and Affect: Mood normal.         Behavior: Behavior normal.         Thought Content: Thought content normal.         Judgment: Judgment normal.                  By signing below, I acknowledge that I have reviewed the above note, dictated by me and scribed by Turkey Ringer, for accuracy and edited  where necessary. The note accurately reflects the services provided at this  encounter. We retain the right to modify this information in the event of  errors. Occasional errors in punctuation, grammar and content may occur.      Clearence Ped, NP    [facility]  [email or web address]

## 2023-06-08 NOTE — Assessment & Plan Note (Addendum)
Progressing as expected  S/p ER visit at [facility] on 06/01/23  S/p staples placement  Staple removal in clinic of 7 staples - see procedure note  Avoid vigorous scrubbing of scalp  Continue to monitor  Follow up as needed

## 2023-09-10 LAB — CHOLESTEROL/HDLC RATIO-QUEST: Chol/HDLC Ratio: 4.9 (calc) (ref <5.0)

## 2023-09-10 LAB — HDL-CHOLESTEROL, BLOOD: HDL Cholesterol: 53 mg/dL (ref >=40)

## 2023-09-10 LAB — CHOLESTEROL, TOTAL BLOOD: Cholesterol: 262 mg/dL — ABNORMAL HIGH (ref <200)

## 2023-09-10 LAB — LDL CHOLESTEROL, DIRECT: LDL-Cholesterol: 177 mg/dL — ABNORMAL HIGH

## 2023-09-10 LAB — TRIGLYCERIDES, BLOOD: Triglycerides: 168 mg/dL — ABNORMAL HIGH (ref <150)

## 2023-09-10 LAB — NON HDL CHOLESTEROL -QUEST: Non-HDL Cholesterol: 209 mg/dL — ABNORMAL HIGH (ref <130)

## 2023-09-14 ENCOUNTER — Encounter (INDEPENDENT_AMBULATORY_CARE_PROVIDER_SITE_OTHER): Payer: 59 | Admitting: Family Medicine

## 2023-09-21 ENCOUNTER — Encounter (INDEPENDENT_AMBULATORY_CARE_PROVIDER_SITE_OTHER): Payer: Self-pay | Admitting: Family

## 2023-09-21 NOTE — Assessment & Plan Note (Signed)
Stable  Reviewed and discussed labs results.  Optometry/Ophthalmology appointment advised.  Yearly dental appointments advised.   Immunizations discussed.  Family and personal medical history discussed and reviewed.  Medications reviewed and altered as needed.  Social history reviewed and discussed.  Healthy diet and routine physical activity advised.

## 2023-09-21 NOTE — Progress Notes (Signed)
[facility]  [place] - [place]-  [place] - [place] - [place]   [email or web address] and [email or web address]  Telephone: [phone]             Encounter Date:  09/23/23  8:34 AM   PCP: Arlice Colt  MRN: [id number]  DOB: 05/17/1956       HPI:  Dwayne Higgins is a 67 year old male presents   Chief Complaint   Patient presents with    Physical     PMHx: HLD, HTN, BPH, elevated PSA  Meds: none  PSHx: lung surgery, lipoma removal  FamHx: M - anemia, HLD, obesity; F - MI, heart disease  SocHx: never smoked, moderate alcohol consumption, moderate caffeine consumption, married, 2 children  PSA: 09/09/2023 - 6.12 (H)  Colon: colonoscopy 10/30/2022 - Results were polyps, mild diverticulosis, and internal hemorrhoids, 7 year plan  Labs: 09/09/2023 - CBC: normal; CMP: Total bilirubin 1.3 (H); Lipid: Total Chol 262 (H), LDL 177 (H), Non-HDL 209 (H), Triglycerides 168 (H)  Imms:  Tdap 06/01/2023, shingles 11/20/2021 + 03/05/2022, pcv23 05/29/2014, prevnar 20 01/18/2020, flu DUE - pt declines  Eye: DUE - scheduled in 2 weeks      History or falls present? NO   Is patient able to preform ADLs without difficulty? YES   Does patient feel safe at home? YES   Does the patient have hearing complaints or concerns? NO   Does patient take Calcium/Vitamin D supplementation? NO  Depression Screening/Mood Disorder: (use PHQ-9 or MMSE as appropriate)      08/31/2022     8:00 AM 08/28/2021     8:00 AM 05/29/2021     9:43 AM 05/29/2021     9:00 AM   FM PHQ9 score   PHQ9 Patient Summary Score (calculated) 0 1 1 1      Patient was asked the following questions during this visit:    Have you been hospitalized since last visit with [place]? NO  Do you have an Advanced Healthcare Directive? YES  Have you fallen in the last 12 mo? NO  Do you have urine control / incontinence concerns? Yes, difficulty to urinate in the morning  Do you have trouble performing daily activities at home? NO  Have you had a PNA shot since age 72?  YES  Have you had a flu shot this year? NO  Do you feel depressed, sad or hopeless at times? NO    ASSESSMENT & PLAN:    Dwayne Higgins was seen today for physical.    Diagnoses and all orders for this visit:    Annual physical exam  Assessment & Plan:  Stable  Reviewed and discussed labs results.  Optometry/Ophthalmology appointment advised.  Yearly dental appointments advised.   Immunizations discussed.  Family and personal medical history discussed and reviewed.  Medications reviewed and altered as needed.  Social history reviewed and discussed.  Healthy diet and routine physical activity advised.      Hypercholesterolemia  Assessment & Plan:  Stable  Lab Results   Component Value Date    CHOL 262 (H) 09/09/2023    LDL 177 (H) 09/09/2023    HDL 53 09/09/2023    TRIG 756 (H) 09/09/2023   Currently diet-controlled   RESTART rosuvastatin 20 mg daily  Reviewed and discussed lab results  Advised to maintain a healthy diet and increase exercises  Recommended to limit carbohydrates  Continue to monitor  Follow up in 3 months    Orders:  -  rosuvastatin (CRESTOR) 20 MG tablet; Take 1 tablet (20 mg) by mouth daily.    Onychomycosis  Assessment & Plan:  Active  Advised to keep area clean and dry  Ordered referral to podiatry for evaluation and treatment  Continue to monitor  Follow up with podiatry    Orders:  -     Referral to Podiatry    Elevated PSA  Assessment & Plan:  Active  Lab Results   Component Value Date    PSA 6.12 (H) 09/09/2023    PSA 4.70 (H) 08/18/2022    PSA 4.87 (H) 08/19/2021   Reviewed and discussed lab results  Discussed rechecking levels in 6 months  Previously followed by urology, Dr. Verlene Mayer  Continue to monitor  Follow up in 6 months            ICD-10-CM ICD-9-CM    1. Annual physical exam  Z00.00 V70.0       2. Hypercholesterolemia  E78.00 272.0 rosuvastatin (CRESTOR) 20 MG tablet      3. Onychomycosis  B35.1 110.1 Referral to Podiatry      4. Elevated PSA  R97.20 790.93                 PROBLEM   LIST:  Patient Active Problem List   Diagnosis    BPH (benign prostatic hyperplasia)    Hypercholesterolemia    Hypertension, unspecified type    Erectile dysfunction, unspecified erectile dysfunction type    Annual physical exam    Elevated PSA    Onychomycosis    Lumbosacral radiculopathy    Impacted cerumen of both ears    Screen for colon cancer    Left wrist pain    Need for vaccination    Screening for endocrine, nutritional, metabolic and immunity disorder    Screening for prostate cancer    Scalp laceration         PAST MEDICAL HISTORY:  Past Medical History:   Diagnosis Date    BPH (benign prostatic hyperplasia) 10/19/1986    Enlarged prostate     Hypercholesterolemia 05/29/2021    Hypertension, unspecified type 05/29/2021       PAST SURGICAL HISTORY:  Past Surgical History:   Procedure Laterality Date    LIPOMA RESECTION      LUNG SURGERY      due to pneumonia - removed 2L fluid          FAMILY HISTORY:   Family History   Problem Relation Name Age of Onset    Cholesterol/Lipid Disorder Mother Boneta Lucks     Obesity Mother Marija     Anemia Mother Marija     Heart Disease Father      Heart Attack Father           SOCIAL HISTORY:  Social History     Socioeconomic History    Marital status: Married     Spouse name: Not on file    Number of children: Not on file    Years of education: Not on file    Highest education level: Not on file   Occupational History    Not on file   Tobacco Use    Smoking status: Never    Smokeless tobacco: Never   Substance and Sexual Activity    Alcohol use: Yes     Alcohol/week: 3.0 standard drinks of alcohol     Types: 1 Glasses of wine, 2 Cans of beer per week     Comment: moderate  Drug use: Never    Sexual activity: Yes     Partners: Female     Birth control/protection: Abstinence   Other Topics Concern    Military Service No    Blood Transfusions No    Caffeine Concern No    Occupational Exposure No    Hobby Hazards No    Sleep Concern No    Stress Concern No    Weight Concern  Yes    Special Diet No    Back Care No    Exercises Regularly Yes    Bike Helmet Use Yes    Seat Belt Use Yes    Performs Self-Exams Yes   Social History Narrative    Not on file     Social Determinants of Health     Financial Resource Strain: Not on file   Food Insecurity: Not on file   Transportation Needs: Not on file   Physical Activity: Not on file   Stress: Not on file   Social Connections: Not on file   Intimate Partner Violence: Not on file   Housing Stability: Not on file     Social History     Tobacco Use   Smoking Status Never   Smokeless Tobacco Never      Social History     Substance and Sexual Activity   Alcohol Use Yes    Alcohol/week: 3.0 standard drinks of alcohol    Types: 1 Glasses of wine, 2 Cans of beer per week    Comment: moderate      Social History     Substance and Sexual Activity   Drug Use Never        Immunization History   Administered Date(s) Administered    (Shingles) Herpes Zoster Recombinant Vaccine Laurita Quint) 11/20/2021, 03/05/2022    COVID-19 [employer] Dibbles) >= 18 Years 02/29/2020    COVID-19 [employer]) Purple Cap >= 12 Years 10/22/2020    DT Absorbed Pediatric; IM 11/27/1995    Influenza Vaccine (Egg Allergy) >=6 Months 08/28/2021    Pneumococcal 20 Vaccine (PREVNAR-20) 01/18/2020    Pneumococcal 23 Vaccine (PNEUMOVAX-23) 05/29/2014    Td 10/19/2000, 10/18/2012    Tdap 08/12/2012, 10/18/2012, 06/01/2023         CURRENT  MEDICATIONS:  No current outpatient medications on file prior to visit.     No current facility-administered medications on file prior to visit.     No outpatient medications have been marked as taking for the 09/23/23 encounter (Office Visit) with Clearence Ped, NP.        ALLERGIES:    No Known Allergies       REVIEW OF SYSTEMS:  Review of Systems   Constitutional:  Negative for activity change, appetite change, chills, diaphoresis, fatigue, fever and unexpected weight change.   HENT:  Negative for congestion, ear discharge, ear pain, hearing loss, postnasal drip,  rhinorrhea, sinus pressure, sinus pain, sneezing, sore throat, tinnitus, trouble swallowing and voice change.    Eyes: Negative.    Respiratory:  Negative for apnea, cough, choking, chest tightness, shortness of breath, wheezing and stridor.    Cardiovascular:  Negative for chest pain, palpitations and leg swelling.   Gastrointestinal:  Negative for abdominal distention, abdominal pain, anal bleeding, blood in stool, constipation, diarrhea, nausea, rectal pain and vomiting.   Endocrine: Negative.    Genitourinary:  Negative for decreased urine volume, difficulty urinating, dysuria, enuresis, flank pain, frequency, genital sores, hematuria, penile discharge, penile pain, penile swelling, scrotal swelling, testicular pain and urgency.  Musculoskeletal:  Negative for arthralgias, back pain, gait problem, joint swelling, myalgias, neck pain and neck stiffness.   Skin: Negative.    Allergic/Immunologic: Negative.    Neurological:  Negative for dizziness, tremors, weakness, light-headedness, numbness and headaches.   Hematological: Negative.    Psychiatric/Behavioral: Negative.                PHYSICAL EXAM:   09/23/23  0820   BP: 130/80   Pulse: 70   Temp: 97.6 F (36.4 C)   Resp: 16   SpO2: 97%     Body mass index is 34.03 kg/m.    Ht Readings from Last 1 Encounters:   09/23/23 5\' 10"  (1.778 m)     Wt Readings from Last 1 Encounters:   09/23/23 107.6 kg (237 lb 3.2 oz)         09/23/2023     8:20 AM 06/08/2023     2:21 PM 05/28/2023     8:51 AM 08/31/2022     8:40 AM 06/10/2022     1:03 PM 01/09/2022     4:16 PM   Date Weight Recorded   Metric 107.593 kg 109.317 kg 107.956 kg 108.863 kg 108.863 kg 108.41 kg   Pounds/Ounces 237 lb 3.2 oz 241 lb 238 lb 240 lb 240 lb 239 lb            Physical Exam  Vitals and nursing note reviewed.   Constitutional:       Appearance: Normal appearance.   HENT:      Head: Normocephalic and atraumatic.   Cardiovascular:      Rate and Rhythm: Normal rate and regular rhythm.      Pulses: Normal  pulses.      Heart sounds: Normal heart sounds.   Pulmonary:      Effort: Pulmonary effort is normal.      Breath sounds: Normal breath sounds.   Abdominal:      General: Bowel sounds are normal.      Palpations: Abdomen is soft.   Neurological:      General: No focal deficit present.      Mental Status: He is alert and oriented to person, place, and time.   Psychiatric:         Mood and Affect: Mood normal.         Behavior: Behavior normal.                  By signing below, I acknowledge that I have reviewed the above note, dictated by me and scribed by Turkey Ringer, for accuracy and edited  where necessary. The note accurately reflects the services provided at this  encounter. We retain the right to modify this information in the event of  errors. Occasional errors in punctuation, grammar and content may occur.      Clearence Ped, NP    [facility]  [email or web address]

## 2023-09-23 ENCOUNTER — Encounter (INDEPENDENT_AMBULATORY_CARE_PROVIDER_SITE_OTHER): Payer: Self-pay | Admitting: Family

## 2023-09-23 ENCOUNTER — Ambulatory Visit (INDEPENDENT_AMBULATORY_CARE_PROVIDER_SITE_OTHER): Payer: Medicare Managed Care Other | Admitting: Family

## 2023-09-23 VITALS — BP 130/80 | HR 70 | Temp 97.6°F | Resp 16 | Ht 70.0 in | Wt 237.2 lb

## 2023-09-23 MED ORDER — ROSUVASTATIN CALCIUM 20 MG OR TABS
20.0000 mg | ORAL_TABLET | Freq: Every day | ORAL | 1 refills | Status: AC
Start: 2023-09-23 — End: ?

## 2023-09-23 NOTE — Assessment & Plan Note (Addendum)
Active  Lab Results   Component Value Date    PSA 6.12 (H) 09/09/2023    PSA 4.70 (H) 08/18/2022    PSA 4.87 (H) 08/19/2021   Reviewed and discussed lab results  Discussed rechecking levels in 6 months  Previously followed by urology, Dr. Verlene Mayer  Continue to monitor  Follow up in 6 months

## 2023-09-23 NOTE — Assessment & Plan Note (Signed)
Active  Advised to keep area clean and dry  Ordered referral to podiatry for evaluation and treatment  Continue to monitor  Follow up with podiatry

## 2023-09-23 NOTE — Assessment & Plan Note (Addendum)
Stable  Lab Results   Component Value Date    CHOL 262 (H) 09/09/2023    LDL 177 (H) 09/09/2023    HDL 53 09/09/2023    TRIG 865 (H) 09/09/2023   Currently diet-controlled   RESTART rosuvastatin 20 mg daily  Reviewed and discussed lab results  Advised to maintain a healthy diet and increase exercises  Recommended to limit carbohydrates  Continue to monitor  Follow up in 3 months

## 2023-09-24 ENCOUNTER — Encounter (INDEPENDENT_AMBULATORY_CARE_PROVIDER_SITE_OTHER): Payer: Self-pay

## 2023-12-02 ENCOUNTER — Telehealth (INDEPENDENT_AMBULATORY_CARE_PROVIDER_SITE_OTHER): Admitting: Family

## 2023-12-02 ENCOUNTER — Encounter (INDEPENDENT_AMBULATORY_CARE_PROVIDER_SITE_OTHER): Payer: Self-pay | Admitting: Family Medicine

## 2023-12-02 NOTE — Progress Notes (Signed)
 [facility]  [place] - [place]-  [place] - [place] - [place]   [email or web address] and [email or web address]  Telephone: [phone]                 Encounter Date:  12/02/23  9:49 AM   PCP: Arlice Colt   MRN: [id number]  DOB: 09-Sep-1956    [facility] VIDEO CALL SERVICES ENCOUNTER  Pandemic Response Ambulatory Protocol    Evaluator(s):   Dwayne Higgins is a 68 year old male  who was evaluated by: Attending Physician (via telemedicine)    Statement:    A Relevant History (including allergies, medications, past medical history, relevant review of systems) and relevant exam as performed by the named provider, are as transcribed in this and/or the accompanying note. Please also see the patient questionnaire for details.    Patient Verification & Video medicine Consent:      -I have verified that the patient's identification to be correct via verbal confirmation of birth date and address & valid: Yes    -The patient, and / or surrogate, has been made aware that patient is to be evaluated today using a home video medicine visit technique (audio, video & digital data transmission): Yes    - The patient, and / or surrogate, has been made aware that patient has the right to refuse this type of evaluation at any time during the assessment period, and has been made aware of any alternatives to this type of evaluation: Yes    -The patient, and / or surrogate,  has been made aware that patient may need further evaluations in the future: Yes    -The patient, and / or surrogate, has signed a valid Informed Consent document (detailing risks, benefits, alternatives & costs), or is exempt from these requirements by law, which I verify is currently present in the [place] MEDICAL RECORD [id number]    Patient is unable to be seen by a specialist in this clinic because:      Pandemic response ambulatory protocol.  Due to COVID-19 pandemic and a federally declared state of public health emergency,  this service is being conducted via video call. Patient is at risk.                    HPI:  Dwayne Higgins is a 68 year old male presents at Home for the following:   Chief Complaint   Patient presents with    Derm Problem     [id number]  growth on bride of nose     Skin Growth  Patient reports he has noticed a growth on the bridge of his nose  States he has noticed the spot has been growing  Notes he feels a bump on the spot  Denies pain to the area  Reports he does not wear sunscreen regularly  States he has noticed the spot is not discolored and he can only see it if he presses on the spot    PROBLEM  LIST:  Patient Active Problem List   Diagnosis    BPH (benign prostatic hyperplasia)    Hypercholesterolemia    Hypertension, unspecified type    Erectile dysfunction, unspecified erectile dysfunction type    Annual physical exam    Elevated PSA    Onychomycosis    Lumbosacral radiculopathy    Impacted cerumen of both ears    Screen for colon cancer    Left wrist pain    Need for vaccination  Screening for endocrine, nutritional, metabolic and immunity disorder    Screening for prostate cancer    Scalp laceration    Skin growth         CURRENT  MEDICATIONS:  Current Outpatient Medications on File Prior to Visit   Medication Sig Dispense Refill    rosuvastatin (CRESTOR) 20 MG tablet Take 1 tablet (20 mg) by mouth daily. 90 tablet 1     No current facility-administered medications on file prior to visit.     No outpatient medications have been marked as taking for the 12/02/23 encounter [facility]) with Clearence Ped, NP.        ALLERGIES:    No Known Allergies       REVIEW OF SYSTEMS:  Review of Systems   Constitutional:  Negative for fever.   Respiratory:  Negative for cough and shortness of breath.    Cardiovascular:  Negative for chest pain, palpitations and leg swelling.   Gastrointestinal:  Negative for abdominal pain.   Neurological:  Negative for headaches.                      PHYSICAL  EXAM:    There were no vitals filed for this visit.  There is no height or weight on file to calculate BMI.    Ht Readings from Last 1 Encounters:   09/23/23 5\' 10"  (1.778 m)     Wt Readings from Last 1 Encounters:   09/23/23 107.6 kg (237 lb 3.2 oz)         General Impression: Looks healthy, alert, no distress, pleasant affect, cooperative, communicative. Speech within normal limits. No signs of neurological decifits seen.                 ASSESSMENT & PLAN:    Dwayne Higgins was seen today for derm problem.    Diagnoses and all orders for this visit:    Skin growth  Assessment & Plan:  Active  Flesh-colored growth to bridge of nose noted in video examination  Advised to avoid rubbing, scratching, or bothering the area  Recommended to wear sunscreen daily  Ordered referral to dermatology for evaluation  Continue to monitor  Follow up with dermatology    Orders:  -     Consult/Referral To Dermatology Ext Com Non-[place] [facility]          ICD-10-CM ICD-9-CM    1. Skin growth  D49.2 239.2 Consult/Referral To Dermatology Ext Com Non- [facility]          Patient instructions:  See EPIC instructions.  Reviewed verbally and/or AVS available via MYchart for patient.      Barriers to learning assessed: None.    Patient/family verbalizes understanding and is agreeable to above plan.    Patient was evaluated by Clearence Ped, NP at [facility]     TIME SPENT ON MEDICAL DISCUSSION: 15 min  Start time: 10:13 AM  Stop time: 10:28 AM      [facility]  [email or web address]      By signing below, I acknowledge that I have reviewed the above note, dictated by me and scribed by Turkey Ringer, for accuracy and edited where necessary. The note accurately reflects the services provided at this encounter. We retain the right to modify this information in the event of errors. Occasional errors in punctuation, grammar and content may occur.        Electronically reviewed and signed by Clearence Ped, NP

## 2023-12-02 NOTE — Assessment & Plan Note (Addendum)
 Active  Flesh-colored growth to bridge of nose noted in video examination  Advised to avoid rubbing, scratching, or bothering the area  Recommended to wear sunscreen daily  Ordered referral to dermatology for evaluation  Continue to monitor  Follow up with dermatology

## 2023-12-03 ENCOUNTER — Encounter (INDEPENDENT_AMBULATORY_CARE_PROVIDER_SITE_OTHER): Payer: Self-pay | Admitting: Hospital

## 2023-12-03 ENCOUNTER — Ambulatory Visit (INDEPENDENT_AMBULATORY_CARE_PROVIDER_SITE_OTHER): Admitting: Family Medicine

## 2023-12-22 ENCOUNTER — Encounter (INDEPENDENT_AMBULATORY_CARE_PROVIDER_SITE_OTHER): Payer: Self-pay | Admitting: Family Medicine

## 2023-12-24 ENCOUNTER — Encounter (INDEPENDENT_AMBULATORY_CARE_PROVIDER_SITE_OTHER): Payer: Self-pay | Admitting: Family Medicine

## 2023-12-24 DIAGNOSIS — Z125 Encounter for screening for malignant neoplasm of prostate: Secondary | ICD-10-CM

## 2023-12-24 DIAGNOSIS — R972 Elevated prostate specific antigen [PSA]: Secondary | ICD-10-CM

## 2023-12-27 ENCOUNTER — Encounter (INDEPENDENT_AMBULATORY_CARE_PROVIDER_SITE_OTHER): Payer: Self-pay | Admitting: Hospital

## 2023-12-29 LAB — PSA (SCREEN), BLOOD: PSA, Total: 4.88 ng/mL — ABNORMAL HIGH (ref <=4.00)

## 2024-01-22 ENCOUNTER — Encounter (INDEPENDENT_AMBULATORY_CARE_PROVIDER_SITE_OTHER): Payer: Self-pay | Admitting: Family Medicine

## 2024-01-22 DIAGNOSIS — H539 Unspecified visual disturbance: Secondary | ICD-10-CM

## 2024-01-25 ENCOUNTER — Encounter (INDEPENDENT_AMBULATORY_CARE_PROVIDER_SITE_OTHER): Payer: Self-pay | Admitting: Hospital

## 2024-03-20 ENCOUNTER — Other Ambulatory Visit (INDEPENDENT_AMBULATORY_CARE_PROVIDER_SITE_OTHER): Payer: Self-pay | Admitting: Family

## 2024-03-20 DIAGNOSIS — E7800 Pure hypercholesterolemia, unspecified: Secondary | ICD-10-CM

## 2024-06-24 ENCOUNTER — Other Ambulatory Visit (INDEPENDENT_AMBULATORY_CARE_PROVIDER_SITE_OTHER): Payer: Self-pay | Admitting: Family

## 2024-06-24 DIAGNOSIS — E7800 Pure hypercholesterolemia, unspecified: Secondary | ICD-10-CM

## 2024-06-29 NOTE — Progress Notes (Signed)
 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 He requires a letter confirming his health status for search and rescue activities, which involve physical tasks such as walking, hiking, running, and standing for extended periods.    The patient reports being in good physical condition with no medical limitations. He states that he is physically more active now than when he was working. He denies experiencing chest pain or shortness of breath. He is currently taking rosuvastatin  for cholesterol management and needs a new prescription as his current one has no refills.    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    Other chronic conditions, if discussed, are documented below in the A/P  Problem List and Family history in the EMR have been reviewed and updated as appropriate    ASSESSMENT & PLAN:     [name] was seen today for other.    Diagnoses and all orders for this visit:    Encounter for completion of form with patient  Assessment & Plan:  Stable  Completed [facility] form today in clinic  Follow up as needed      Hypercholesterolemia  Assessment & Plan:  Stable  Lab Results   Component Value Date     CHOL 262 (H) 09/09/2023    LDL 177 (H) 09/09/2023    HDL 53 09/09/2023    TRIG 831 (H) 09/09/2023   Currently diet-controlled   Refilled rosuvastatin  20 mg daily  Advised to maintain a healthy diet and increase exercises  Recommended to limit carbohydrates  Continue to monitor  Follow up as scheduled              ICD-10-CM ICD-9-CM    1. Encounter for completion of form with patient  Z02.89 V68.89       2. Hypercholesterolemia  E78.00 272.0               SUPPORTING OBJECTIVE / SUBJECTIVE:     PHYSICAL EXAM:     06/30/24  1451   BP: 134/72   Pulse: 68   Temp: 97.6 F (36.4 C)   Resp: 18   SpO2: 97%       Ht Readings from Last 1 Encounters:   06/30/24 5' 10.75 (1.797 m)       Wt Readings from Last 1 Encounters:   06/30/24 110.3 kg (243 lb 3.2 oz)     Body mass index is 34.16 kg/m.         06/30/2024     2:51 PM 09/23/2023     8:20 AM 06/08/2023     2:21 PM  05/28/2023     8:51 AM 08/31/2022     8:40 AM 06/10/2022     1:03 PM   Date Weight Recorded   Metric 110.315 kg 107.593 kg 109.317 kg 107.956 kg 108.863 kg 108.863 kg   Pounds/Ounces 243 lb 3.2 oz 237 lb 3.2 oz 241 lb 238 lb 240 lb 240 lb        Physical Exam  Vitals and nursing note reviewed. Exam conducted with a chaperone present.   Constitutional:       Appearance: He is well-developed.   HENT:      Head: Normocephalic and atraumatic.   Cardiovascular:      Rate and Rhythm: Normal rate and regular rhythm.      Heart sounds: Normal heart sounds. No murmur heard.     No friction rub. No gallop.   Pulmonary:      Effort: Pulmonary effort is normal. No respiratory distress.      Breath sounds: Normal breath sounds. No wheezing, rhonchi or rales.   Skin:     Findings: No rash.   Neurological:      Mental Status: He is alert.         ALLERGY / MEDICATIONS / SURGICAL HISTORY / SOCIAL HISTORY / IMMUNIZATIONS / ROS      PAST SURGICAL HISTORY:  Past Surgical History[1]     SOCIAL HISTORY:  Social History (brief version)[2]    Immunization History   Administered Date(s)  Administered    (Shingles) Herpes Zoster Recombinant Vaccine [facility]) 11/20/2021, 03/05/2022    COVID-19 Andree) >= 18 Years 02/29/2020    COVID-19 (Pfizer) Purple Cap >= 12 Years 10/22/2020    DT Absorbed Pediatric; IM 11/27/1995    Influenza Vaccine (Egg Allergy) >=6 Months 08/28/2021    Pneumococcal 20 Vaccine (PREVNAR-20) 01/18/2020    Pneumococcal 23 Vaccine (PNEUMOVAX-23) 05/29/2014    Td 10/19/2000, 10/18/2012    Tdap 08/12/2012, 10/18/2012, 06/01/2023       ALLERGIES:  Allergies[3]     CURRENT  MEDICATIONS:  No outpatient medications have been marked as taking for the 06/30/24 encounter (Office Visit) with Cleotilde Riis, DO.          REVIEW OF SYSTEMS:   (unless listed below, the ROS  is negative except as listed in HPI)  Review of Systems        By signing below, I acknowledge that I have reviewed the above note,  dictated by me and scribed by Rodell Finder, for accuracy and edited  where necessary. The note accurately reflects the services provided at this  encounter. We retain the right to modify this information in the event of  errors. Occasional errors in punctuation, grammar and content may occur.         Electronically signed and reviewed by Riis Cleotilde, DO      [facility]  [email or web address]         [1]   Past Surgical History:  Procedure Laterality Date    LIPOMA RESECTION      LUNG SURGERY      due to pneumonia - removed 2L fluid   [2]   Socioeconomic History    Marital status: Married   Tobacco Use    Smoking status: Never    Smokeless tobacco: Never   Substance and Sexual Activity    Alcohol use: Yes     Alcohol/week: 3.0 standard drinks of alcohol     Types: 1 Glasses of wine, 2 Cans of beer per  week     Comment: moderate    Drug use: Never    Sexual activity: Yes     Partners: Female     Birth control/protection: Abstinence   Social Activities of Daily Living Present    Military Service No    Blood Transfusions No    Caffeine Concern No    Occupational Exposure No     Hobby Hazards No    Sleep Concern No    Stress Concern No    Weight Concern Yes    Special Diet No    Back Care No    Exercises Regularly Yes    Bike Helmet Use Yes    Seat Belt Use Yes    Performs Self-Exams Yes   [3] No Known Allergies

## 2024-06-30 ENCOUNTER — Ambulatory Visit (INDEPENDENT_AMBULATORY_CARE_PROVIDER_SITE_OTHER): Admitting: Family Medicine

## 2024-06-30 ENCOUNTER — Encounter (INDEPENDENT_AMBULATORY_CARE_PROVIDER_SITE_OTHER): Payer: Self-pay | Admitting: Family Medicine

## 2024-06-30 VITALS — BP 134/72 | HR 68 | Temp 97.6°F | Resp 18 | Ht 70.75 in | Wt 243.2 lb

## 2024-06-30 NOTE — Assessment & Plan Note (Signed)
 Stable  Lab Results   Component Value Date    CHOL 262 (H) 09/09/2023    LDL 177 (H) 09/09/2023    HDL 53 09/09/2023    TRIG 831 (H) 09/09/2023   Currently diet-controlled   Refilled rosuvastatin  20 mg daily  Advised to maintain a healthy diet and increase exercises  Recommended to limit carbohydrates  Continue to monitor  Follow up as scheduled

## 2024-06-30 NOTE — Assessment & Plan Note (Signed)
 Stable  Completed [facility] form today in clinic  Follow up as needed

## 2024-07-04 MED ORDER — ROSUVASTATIN CALCIUM 20 MG OR TABS
20.0000 mg | ORAL_TABLET | Freq: Every day | ORAL | 3 refills | Status: AC
Start: 2024-07-04 — End: ?

## 2024-08-08 ENCOUNTER — Encounter (INDEPENDENT_AMBULATORY_CARE_PROVIDER_SITE_OTHER): Payer: Self-pay | Admitting: Family Medicine

## 2024-08-08 DIAGNOSIS — M25562 Pain in left knee: Secondary | ICD-10-CM

## 2024-08-13 ENCOUNTER — Ambulatory Visit (INDEPENDENT_AMBULATORY_CARE_PROVIDER_SITE_OTHER): Payer: Self-pay | Admitting: Family Medicine

## 2024-09-19 ENCOUNTER — Telehealth (INDEPENDENT_AMBULATORY_CARE_PROVIDER_SITE_OTHER): Payer: Self-pay | Admitting: Family

## 2024-09-19 ENCOUNTER — Encounter (INDEPENDENT_AMBULATORY_CARE_PROVIDER_SITE_OTHER): Payer: Self-pay | Admitting: Family

## 2024-09-19 DIAGNOSIS — Z125 Encounter for screening for malignant neoplasm of prostate: Secondary | ICD-10-CM

## 2024-09-19 DIAGNOSIS — R5383 Other fatigue: Secondary | ICD-10-CM

## 2024-09-19 DIAGNOSIS — R972 Elevated prostate specific antigen [PSA]: Secondary | ICD-10-CM

## 2024-09-19 DIAGNOSIS — Z13220 Encounter for screening for lipoid disorders: Secondary | ICD-10-CM

## 2024-09-19 NOTE — Telephone Encounter (Signed)
 Labs ordered.

## 2024-09-21 LAB — COMPREHENSIVE METABOLIC PANEL, BLOOD
ALT (SGPT): 37 U/L (ref 9–46)
AST (SGOT): 25 U/L (ref 10–35)
Albumin/Glob Ratio: 1.6 (calc) (ref 1.0–2.5)
Albumin: 4.5 g/dL (ref 3.6–5.1)
Alkaline Phos: 72 U/L (ref 35–144)
BUN: 17 mg/dL (ref 7–25)
Bilirubin, Total: 1.3 mg/dL — ABNORMAL HIGH (ref 0.2–1.2)
Calcium: 9.3 mg/dL (ref 8.6–10.3)
Carbon Dioxide: 28 mmol/L (ref 20–32)
Chloride: 101 mmol/L (ref 98–110)
Creatinine: 0.99 mg/dL (ref 0.70–1.35)
EGFR: 83 mL/min/1.73m2 (ref >=60)
Globulin: 2.9 g/dL (ref 1.9–3.7)
Glucose: 89 mg/dL (ref 65–99)
Potassium: 4.7 mmol/L (ref 3.5–5.3)
Sodium: 138 mmol/L (ref 135–146)
Total Protein: 7.4 g/dL (ref 6.1–8.1)

## 2024-09-21 LAB — HEMOGRAM, BLOOD
HCT: 47.8 % (ref 39.4–51.1)
HGB: 15.3 g/dL (ref 13.2–17.1)
MCH: 28.6 pg (ref 27.0–33.0)
MCHC: 32 g/dL (ref 31.6–35.4)
MCV: 89.3 fL (ref 81.4–101.7)
MPV: 10.7 fL (ref 7.5–12.5)
PLT: 250 Thousand/uL (ref 140–400)
RBC: 5.35 Million/uL (ref 4.20–5.80)
RDW: 12.9 % (ref 11.0–15.0)
WBC: 9.7 Thousand/uL (ref 3.8–10.8)

## 2024-09-21 LAB — PSA (SCREEN), BLOOD: PSA, Total: 5.46 ng/mL — ABNORMAL HIGH (ref <=4.00)

## 2024-09-21 LAB — CHOLESTEROL/HDLC RATIO-QUEST: Chol/HDLC Ratio: 3 (calc) (ref <5.0)

## 2024-09-21 LAB — CHOLESTEROL, TOTAL BLOOD: Cholesterol: 167 mg/dL (ref <200)

## 2024-09-21 LAB — HDL-CHOLESTEROL, BLOOD: HDL Cholesterol: 56 mg/dL (ref >=40)

## 2024-09-21 LAB — TRIGLYCERIDES, BLOOD: Triglycerides: 114 mg/dL (ref <150)

## 2024-09-21 LAB — NON HDL CHOLESTEROL -QUEST: Non-HDL Cholesterol: 111 mg/dL (ref <130)

## 2024-09-21 LAB — LDL CHOLESTEROL, DIRECT: LDL-Cholesterol: 90 mg/dL

## 2024-09-25 ENCOUNTER — Encounter (INDEPENDENT_AMBULATORY_CARE_PROVIDER_SITE_OTHER): Payer: Self-pay | Admitting: Family

## 2024-09-25 ENCOUNTER — Ambulatory Visit (INDEPENDENT_AMBULATORY_CARE_PROVIDER_SITE_OTHER): Admitting: Family

## 2024-09-25 VITALS — BP 110/82 | HR 55 | Temp 98.5°F | Resp 16 | Ht 70.75 in | Wt 240.0 lb

## 2024-09-25 NOTE — Assessment & Plan Note (Signed)
 Stable  Reviewed and discussed labs results.  Optometry/Ophthalmology appointment advised.  Yearly dental appointments advised.   Immunizations discussed.  Family and personal medical history discussed and reviewed.  Medications reviewed and altered as needed.  Social history reviewed and discussed.  Healthy diet and routine physical activity advised.

## 2024-09-25 NOTE — Assessment & Plan Note (Signed)
 Stable  FIT Test ordered for evaluation  Continue to monitor  Follow up per results

## 2024-09-25 NOTE — Assessment & Plan Note (Signed)
 New  Recommended to avoid strenuous activities  Advised to take Tylenol as needed for pain  Recommended to avoid walking barefoot  Ordered x-ray of right foot for evaluation  Continue to monitor  Follow up per results

## 2024-09-25 NOTE — Assessment & Plan Note (Signed)
 Stable   Advised to perform stretching exercises at home  Recommended to avoid strenuous activities  Advised to take Tylenol as needed for pain  Recommended to continue use of sleeve on knee  Offered referral to physical therapy - pt defers  Continue to monitor  Follow up as needed

## 2024-09-25 NOTE — Progress Notes (Signed)
 [facility]  [place] - [place]-  [place] - [place] - [place]   [email or web address] and [email or web address]  Telephone: [phone]             Encounter Date:  09/25/24  8:23 AM   PCP: Dwayne Higgins  MRN: [id number]  DOB: 1956-07-25       HPI:  Dwayne Higgins is a 68 year old male presents   Chief Complaint   Patient presents with    Physical     Patient presents for CPE  PMHx: HLD, exercise-induced HTN, BPH, elevated PSA   Meds: rosuvastatin   PSHx: lung surgery (removed 2L fluid d/t pneumonia), lipoma removal   FamHx: M - anemia, HLD, obesity; F - MI, heart disease   SocHx: never smoked, moderate alcohol consumption (2-3 times per week), moderate caffeine consumption, married, 2 children - healthy  Labs: 09/20/2024  PSA: 09/20/2024 - 5.46 (H)  Colonoscopy: colonoscopy 10/30/2022 - Results were polyps, mild diverticulosis, and internal hemorrhoids, 7 year plan   Imms: Tdap 06/01/2023, shingles 11/20/2021 + 03/05/2022, pcv23 05/29/2014, prevnar 20 01/18/2020, flu DUE - pt declines   Eye: UTD - cataracts to R eye        09/25/2024     8:00 AM 09/23/2023     9:00 AM 08/31/2022     8:00 AM 08/28/2021     8:00 AM 05/29/2021     9:43 AM 05/29/2021     9:00 AM   FM PHQ9 score   PHQ9 Patient Summary Score (calculated) 0 1 0 1 1 1      Acute Foreign Body of Right Foot  Patient reports he stepped on a glass shard by the right fifth toe  States he has not been able to see anything and the puncture is healed  Notes he experiences pain when walking barefoot, but he has noticed improvement with the pain when wearing shoes with support    ASSESSMENT & PLAN:    Dwayne Higgins was seen today for physical.    Diagnoses and all orders for this visit:    Annual physical exam  Assessment & Plan:  Stable  Reviewed and discussed labs results.  Optometry/Ophthalmology appointment advised.  Yearly dental appointments advised.   Immunizations discussed.  Family and personal medical history discussed and reviewed.  Medications reviewed  and altered as needed.  Social history reviewed and discussed.  Healthy diet and routine physical activity advised.      Elevated PSA  Assessment & Plan:  Active  Lab Results   Component Value Date    PSA 5.46 (H) 09/20/2024    PSA 4.88 (H) 12/28/2023    PSA 6.12 (H) 09/09/2023   Reviewed and discussed lab results  Discussed rechecking levels in 6 months  Previously followed by urology, Dr. Sherol  Continue to monitor  Follow up in 6 months      Screen for colon cancer  Assessment & Plan:  Stable  FIT Test ordered for evaluation  Continue to monitor  Follow up per results    Orders:  -     Stool Immunochemical Occult Blood    Acute foreign body of right foot, initial encounter  Assessment & Plan:  New  Recommended to avoid strenuous activities  Advised to take Tylenol as needed for pain  Recommended to avoid walking barefoot  Ordered x-ray of right foot for evaluation  Continue to monitor  Follow up per results    Orders:  -     X-Ray Foot Complete, min  2 views    Acute pain of left knee  Assessment & Plan:  Stable   Advised to perform stretching exercises at home  Recommended to avoid strenuous activities  Advised to take Tylenol as needed for pain  Recommended to continue use of sleeve on knee  Offered referral to physical therapy - pt defers  Continue to monitor  Follow up as needed            ICD-10-CM ICD-9-CM    1. Annual physical exam  Z00.00 V70.0       2. Elevated PSA  R97.20 790.93       3. Screen for colon cancer  Z12.11 V76.51 Stool Immunochemical Occult Blood      4. Acute foreign body of right foot, initial encounter  S90.851A 917.6 X-Ray Foot Complete, min 2 views      5. Acute pain of left knee  M25.562 719.46                 PROBLEM  LIST:  Problem List[1]      PAST MEDICAL HISTORY:  Past Medical History[2]    PAST SURGICAL HISTORY:  Past Surgical History[3]     FAMILY HISTORY:   Family History[4]      SOCIAL HISTORY:  Social History     Socioeconomic History    Marital status: Married     Spouse  name: Not on file    Number of children: 2    Years of education: Not on file    Highest education level: Not on file   Occupational History    Not on file   Tobacco Use    Smoking status: Never    Smokeless tobacco: Never   Substance and Sexual Activity    Alcohol use: Yes     Alcohol/week: 3.0 standard drinks of alcohol     Types: 1 Glasses of wine, 2 Cans of beer per week     Comment: moderate    Drug use: Never    Sexual activity: Yes     Partners: Female     Birth control/protection: Abstinence   Other Topics Concern    Military Service No    Blood Transfusions No    Caffeine Concern No    Occupational Exposure No    Hobby Hazards No    Sleep Concern No    Stress Concern No    Weight Concern Yes    Special Diet No    Back Care No    Exercises Regularly Yes    Bike Helmet Use Yes    Seat Belt Use Yes    Performs Self-Exams Yes   Social History Narrative    Not on file     Social Drivers of Health     Financial Resource Strain: Not on file   Food Insecurity: Not on file   Transportation Needs: Not on file   Physical Activity: Not on file   Stress: Not on file   Social Connections: Not on file   Intimate Partner Violence: Not on file   Housing Stability: Not on file     Tobacco Use History[5]   Social History     Substance and Sexual Activity   Alcohol Use Yes    Alcohol/week: 3.0 standard drinks of alcohol    Types: 1 Glasses of wine, 2 Cans of beer per week    Comment: moderate      Social History     Substance and Sexual Activity  Drug Use Never        Immunization History   Administered Date(s) Administered    (Shingles) Herpes Zoster Recombinant Vaccine [facility]) 11/20/2021, 03/05/2022    COVID-19 Andree) >= 18 Years 02/29/2020    COVID-19 (Pfizer) Purple Cap >= 12 Years 10/22/2020    DT Absorbed Pediatric; IM 11/27/1995    Influenza Vaccine (Egg Allergy) >=6 Months 08/28/2021    Pneumococcal 20 Vaccine (PREVNAR-20) 01/18/2020    Pneumococcal 23 Vaccine (PNEUMOVAX-23) 05/29/2014    Td 10/19/2000,  10/18/2012    Tdap 08/12/2012, 10/18/2012, 06/01/2023         CURRENT  MEDICATIONS:  Medications Ordered Prior to Encounter[6]  No outpatient medications have been marked as taking for the 09/25/24 encounter (Office Visit) with Jacqulyne Shuck, NP.        ALLERGIES:    Allergies[7]       REVIEW OF SYSTEMS:  Review of Systems   Constitutional:  Negative for activity change, appetite change, chills, diaphoresis, fatigue, fever and unexpected weight change.   HENT:  Negative for congestion, ear discharge, ear pain, hearing loss, postnasal drip, rhinorrhea, sinus pressure, sinus pain, sneezing, sore throat, tinnitus, trouble swallowing and voice change.    Eyes: Negative.    Respiratory:  Negative for apnea, cough, choking, chest tightness, shortness of breath, wheezing and stridor.    Cardiovascular:  Negative for chest pain, palpitations and leg swelling.   Gastrointestinal:  Negative for abdominal distention, abdominal pain, anal bleeding, blood in stool, constipation, diarrhea, nausea, rectal pain and vomiting.   Endocrine: Negative.    Genitourinary:  Negative for decreased urine volume, difficulty urinating, dysuria, enuresis, flank pain, frequency, genital sores, hematuria, penile discharge, penile pain, penile swelling, scrotal swelling, testicular pain and urgency.   Musculoskeletal:  Negative for arthralgias, back pain, gait problem, joint swelling, myalgias, neck pain and neck stiffness.   Skin: Negative.    Allergic/Immunologic: Negative.    Neurological:  Negative for dizziness, tremors, weakness, light-headedness, numbness and headaches.   Hematological: Negative.    Psychiatric/Behavioral: Negative.                PHYSICAL EXAM:   09/25/24  0825   BP: 110/82   Pulse: 55   Temp: 98.5 F (36.9 C)   Resp: 16   SpO2: 97%     Body mass index is 33.71 kg/m.    Ht Readings from Last 1 Encounters:   09/25/24 5' 10.75 (1.797 m)     Wt Readings from Last 1 Encounters:   09/25/24 108.9 kg (240 lb)          09/25/2024     8:25 AM 06/30/2024     2:51 PM 09/23/2023     8:20 AM 06/08/2023     2:21 PM 05/28/2023     8:51 AM 08/31/2022     8:40 AM   Date Weight Recorded   Metric 108.863 kg 110.315 kg 107.593 kg 109.317 kg 107.956 kg 108.863 kg   Pounds/Ounces 240 lb 243 lb 3.2 oz 237 lb 3.2 oz 241 lb 238 lb 240 lb            Physical Exam  Vitals and nursing note reviewed.   Constitutional:       Appearance: Normal appearance.   HENT:      Head: Normocephalic and atraumatic.   Cardiovascular:      Rate and Rhythm: Normal rate and regular rhythm.      Pulses: Normal pulses.      Heart  sounds: Normal heart sounds.   Pulmonary:      Effort: Pulmonary effort is normal.      Breath sounds: Normal breath sounds.   Abdominal:      General: Bowel sounds are normal.      Palpations: Abdomen is soft.   Neurological:      General: No focal deficit present.      Mental Status: He is alert and oriented to person, place, and time.   Psychiatric:         Mood and Affect: Mood normal.         Behavior: Behavior normal.                  By signing below, I acknowledge that I have reviewed the above note, dictated by me and scribed by Victoria Ringer, for accuracy and edited  where necessary. The note accurately reflects the services provided at this  encounter. We retain the right to modify this information in the event of  errors. Occasional errors in punctuation, grammar and content may occur.        Electronically signed by Ronal Heather, FNP-C of [facility]      [facility]  [email or web address]                       [1]   Patient Active Problem List  Diagnosis    BPH (benign prostatic hyperplasia)    Hypercholesterolemia    Hypertension, unspecified type    Erectile dysfunction, unspecified erectile dysfunction type    Annual physical exam    Elevated PSA    Onychomycosis    Lumbosacral radiculopathy    Impacted cerumen of both ears    Screen for colon cancer    Left wrist pain    Need for vaccination     Screening for endocrine, nutritional, metabolic and immunity disorder    Screening for prostate cancer    Scalp laceration    Skin growth    Encounter for completion of form with patient    Right cataract    Acute foreign body of right foot, initial encounter    Acute pain of left knee   [2]   Past Medical History:  Diagnosis Date    BPH (benign prostatic hyperplasia) 10/19/1986    Elevated PSA 08/28/2021    Hypercholesterolemia 05/29/2021    Hypertension, unspecified type 05/29/2021   [3]   Past Surgical History:  Procedure Laterality Date    LIPOMA RESECTION      LUNG SURGERY      due to pneumonia - removed 2L fluid   [4]   Family History  Problem Relation Name Age of Onset    Cholesterol/Lipid Disorder Mother mother     Obesity Mother mother     Anemia Mother mother     Heart Disease Father Fairy     Heart Attack Father Fairy    [5]   Social History  Tobacco Use   Smoking Status Never   Smokeless Tobacco Never   [6]   Current Outpatient Medications on File Prior to Visit   Medication Sig Dispense Refill    rosuvastatin  (CRESTOR ) 20 MG tablet Take 1 tablet (20 mg) by mouth daily. 100 tablet 3     No current facility-administered medications on file prior to visit.   [7] No Known Allergies

## 2024-09-25 NOTE — Assessment & Plan Note (Signed)
 Active  Lab Results   Component Value Date    PSA 5.46 (H) 09/20/2024    PSA 4.88 (H) 12/28/2023    PSA 6.12 (H) 09/09/2023   Reviewed and discussed lab results  Discussed rechecking levels in 6 months  Previously followed by urology, Dr. Sherol  Continue to monitor  Follow up in 6 months

## 2024-09-28 ENCOUNTER — Ambulatory Visit (INDEPENDENT_AMBULATORY_CARE_PROVIDER_SITE_OTHER): Payer: Self-pay | Admitting: Family

## 2024-09-29 ENCOUNTER — Encounter (INDEPENDENT_AMBULATORY_CARE_PROVIDER_SITE_OTHER): Payer: Self-pay | Admitting: Hospital

## 2024-09-29 NOTE — Telephone Encounter (Signed)
 Sent patient mychart message

## 2024-09-29 NOTE — Telephone Encounter (Signed)
 Patient viewed message Last read by [name] Mori at [facility] on 09/29/2024.

## 2024-10-05 LAB — STOOL IMMUNOCHEMICAL OCCULT BLOOD

## 2024-10-15 ENCOUNTER — Encounter (INDEPENDENT_AMBULATORY_CARE_PROVIDER_SITE_OTHER): Payer: Self-pay | Admitting: Family Medicine

## 2024-10-17 NOTE — Telephone Encounter (Signed)
 Need an appointment for knee. Ok to see any available physician or advanced health care provider.      Need office visit documentation in order to consider MRI approval from insurance.    Dr. Penne Pinal  Family Medicine Physician  [facility]  [phone]  [email or web address]

## 2024-10-24 NOTE — Telephone Encounter (Signed)
 LVMTCB 10/24/24 to inform patient that the provider wants them to schedule an appointment in order for us  to have documentation for an MRI order    Patient MyChart Message -     On Oct 22 I sent this message:  I fell on my knee  few weeks ago. Pain seems to be getting worse  I do not think it is the ACL, but the ligament on the inside of the leg connecting the femur to tibia (MCL). .., The xray showed no broken or cracked bone.  The pain is getting worse. Ibuprofen 600 mg helps for a few hours. Can you recommend and refer an MRI, physical therapy or both

## 2024-11-01 ENCOUNTER — Encounter (INDEPENDENT_AMBULATORY_CARE_PROVIDER_SITE_OTHER): Payer: Self-pay

## 2024-11-01 ENCOUNTER — Ambulatory Visit (INDEPENDENT_AMBULATORY_CARE_PROVIDER_SITE_OTHER)

## 2024-11-01 VITALS — BP 134/92 | HR 82 | Temp 98.0°F | Resp 16 | Ht 70.75 in | Wt 243.8 lb

## 2024-11-01 NOTE — Assessment & Plan Note (Addendum)
 Chronic, stable  S/p previous injury involving falling out of a tree from 07/2024  XRAY Left knee completed from 07/2024, reviewed above  Patient referred to physical therapy for further work up / evaluation  Recommended hinged left knee brace  Recommend warm/cold compresses, IcyHot/Bengay/Biofreeze, light stretching and regular exercise as tolerating, Tylenol prn, sleep in a comfortable position  Avoid strenuous activity/heavy lifting   F/u if sx worsen or fail to improve after physical therapy, discussed imaging such as MRI left knee   Continue to monitor

## 2024-11-01 NOTE — Progress Notes (Signed)
 [place] - [place]-  [place] - [place] - [place]   [email or web address]  [email or web address]   Telephone: [phone]             Encounter Date:  11/01/2024  2:10 PM PST   PCP: Cleotilde Riis  MRN: [id number]  DOB: 1956-02-27       HPI:  Dwayne Higgins is a 69 year old male presents   Chief Complaint   Patient presents with    Knee Injury     Pt is present for LT knee injury, pt has gotten x-ray. Pain has been presetn x several months     Referral/authorization     PT Referral or MRI request      #Chronic pain of left knee  Patient, 69 year old male, presents in clinic to discuss chronic pains of the left knee onset after injury related to falling from an tree in October of 2025. Patient did complete imaging after said injury consisting of XRAY left knee from 08/12/2024 which was negative for any fracture or dislocation but positive for an patellar spur. At this time, he has been utilizing at home stretching alongside an knee brace but the pains continue if undergoing prolonged walking or when lying on the right side of the body nightly. Otherwise, his other therapies consist of ice, heating pads, and OTC topicals. Patient expresses interest in physical therapy referrals vs MRI imaging.    #XRAY Left Knee 08/12/2024  FINDINGS:   Alignment: Alignment is anatomic.     Bones: No fracture or focal bony lesion is identified.     Joint Space: The joint space is well maintained with no degenerative changes.     Soft Tissue: No chondrocalcinosis or significant suprapatellar joint effusion appreciated.     IMPRESSION: There is a patellar spur     PROBLEM  LIST:  Problem List[1]      PAST MEDICAL HISTORY:  Past Medical History[2]    PAST SURGICAL HISTORY:  Past Surgical History[3]     FAMILY HISTORY:   Family History[4]      SOCIAL HISTORY:  Social History     Socioeconomic History    Marital status: Married     Spouse name: Not on file    Number of children: 2    Years of education: Not on file    Highest  education level: Not on file   Occupational History    Not on file   Tobacco Use    Smoking status: Never    Smokeless tobacco: Never   Substance and Sexual Activity    Alcohol use: Yes     Alcohol/week: 3.0 standard drinks of alcohol     Types: 1 Glasses of wine, 2 Cans of beer per week     Comment: moderate    Drug use: Never    Sexual activity: Yes     Partners: Female     Birth control/protection: Abstinence   Other Topics Concern    Military Service No    Blood Transfusions No    Caffeine Concern No    Occupational Exposure No    Hobby Hazards No    Sleep Concern No    Stress Concern No    Weight Concern Yes    Special Diet No    Back Care No    Exercises Regularly Yes    Bike Helmet Use Yes    Seat Belt Use Yes    Performs Self-Exams Yes   Social History Narrative  Not on file     Social Drivers of Health     Financial Resource Strain: Not on file   Food Insecurity: Not on file   Transportation Needs: Not on file   Physical Activity: Not on file   Stress: Not on file   Social Connections: Not on file   Intimate Partner Violence: Not on file   Housing Stability: Not on file     Tobacco Use History[5]   Social History     Substance and Sexual Activity   Alcohol Use Yes    Alcohol/week: 3.0 standard drinks of alcohol    Types: 1 Glasses of wine, 2 Cans of beer per week    Comment: moderate      Social History     Substance and Sexual Activity   Drug Use Never        Immunization History   Administered Date(s) Administered    (Shingles) Herpes Zoster Recombinant Vaccine [facility]) 11/20/2021, 03/05/2022    COVID-19 Andree) >= 18 Years 02/29/2020    COVID-19 Autonation) Purple Cap >= 12 Years 10/22/2020    DT Absorbed Pediatric; IM 11/27/1995    Influenza Vaccine (Egg Allergy) >=6 Months 08/28/2021    Pneumococcal 20 Vaccine (PREVNAR-20) 01/18/2020    Pneumococcal 23 Vaccine (PNEUMOVAX-23) 05/29/2014    Td 10/19/2000, 10/18/2012    Tdap 08/12/2012, 10/18/2012, 06/01/2023         CURRENT  MEDICATIONS:  Medications  Ordered Prior to Encounter[6]  Outpatient Medications Marked as Taking for the 11/01/24 encounter (Office Visit) with Tobie Cooley, NP   Medication Sig Dispense Refill    ibuprofen (MOTRIN) 600 MG tablet Take 1 tablet (600 mg) by mouth every 6 hours as needed for Moderate Pain (Pain Score 4-6).      rosuvastatin  (CRESTOR ) 20 MG tablet Take 1 tablet (20 mg) by mouth daily. 100 tablet 3        ALLERGIES:    Allergies[7]       REVIEW OF SYSTEMS:  Review of Systems   Constitutional:  Negative for chills, fatigue and fever.   HENT:  Negative for congestion, sinus pain and sore throat.    Respiratory:  Negative for cough, chest tightness and shortness of breath.    Cardiovascular:  Negative for chest pain and palpitations.   Gastrointestinal:  Negative for constipation, diarrhea, nausea and vomiting.   Endocrine: Negative for cold intolerance and heat intolerance.   Genitourinary:  Negative for difficulty urinating.   Neurological:  Negative for weakness and headaches.     NEGATIVE EXCEPT FOR HPI       PHYSICAL EXAM:   11/01/24  1401   BP: (!) 134/92   Pulse: 82   Temp: 98 F (36.7 C)   Resp: 16   SpO2: 97%     Body mass index is 34.24 kg/m.    Ht Readings from Last 1 Encounters:   11/01/24 5' 10.75 (1.797 m)     Wt Readings from Last 1 Encounters:   11/01/24 110.6 kg (243 lb 12.8 oz)           Physical Exam  Vitals and nursing note reviewed.   Constitutional:       General: He is not in acute distress.  HENT:      Head: Normocephalic and atraumatic.   Cardiovascular:      Rate and Rhythm: Normal rate and regular rhythm.      Pulses: Normal pulses.      Heart sounds: Normal heart sounds.  No murmur heard.     No friction rub. No gallop.   Pulmonary:      Effort: Pulmonary effort is normal. No respiratory distress.      Breath sounds: Normal breath sounds. No wheezing or rales.   Abdominal:      General: Abdomen is flat. Bowel sounds are normal.      Palpations: Abdomen is soft.   Musculoskeletal:      Left knee:  Swelling present.   Skin:     General: Skin is warm.   Neurological:      General: No focal deficit present.      Mental Status: He is alert.   Psychiatric:         Mood and Affect: Mood normal.         Behavior: Behavior normal.                  ASSESSMENT & PLAN:    [name] was seen today for knee injury and referral/authorization.    Diagnoses and all orders for this visit:    Chronic pain of left knee  Overview:  XRAY Left Knee 08/12/2024  FINDINGS:   Alignment: Alignment is anatomic.     Bones: No fracture or focal bony lesion is identified.     Joint Space: The joint space is well maintained with no degenerative changes.     Soft Tissue: No chondrocalcinosis or significant suprapatellar joint effusion appreciated.     IMPRESSION: There is a patellar spur     Assessment & Plan:  Chronic, stable  S/p previous injury involving falling out of a tree from 07/2024  XRAY Left knee completed from 07/2024, reviewed above  Patient referred to physical therapy for further work up / evaluation  Recommended hinged left knee brace  Recommend warm/cold compresses, IcyHot/Bengay/Biofreeze, light stretching and regular exercise as tolerating, Tylenol prn, sleep in a comfortable position  Avoid strenuous activity/heavy lifting   F/u if sx worsen or fail to improve after physical therapy, discussed imaging such as MRI left knee   Continue to monitor       Orders:  -     Physical Therapy          ICD-10-CM ICD-9-CM    1. Chronic pain of left knee  M25.562 719.46 Physical Therapy    G89.29 338.29                 By signing below, I acknowledge that I have reviewed the above note,  dictated by me and scribed by Ian Poland for accuracy and edited  where necessary. The note accurately reflects the services provided at this encounter. We retain the right to modify this information in the event of errors. Occasional errors in punctuation, grammar and content may occur.        Electronically signed by Quinten Blanch, NP    [facility]  [email or web address]         [1]   Patient Active Problem List  Diagnosis    BPH (benign prostatic hyperplasia)    Hypercholesterolemia    Hypertension, unspecified type    Erectile dysfunction, unspecified erectile dysfunction type    Annual physical exam    Elevated PSA    Onychomycosis    Lumbosacral radiculopathy    Impacted cerumen of both ears    Screen for colon cancer    Left wrist pain    Need for vaccination    Screening for endocrine, nutritional, metabolic and  immunity disorder    Screening for prostate cancer    Scalp laceration    Skin growth    Encounter for completion of form with patient    Right cataract    Acute foreign body of right foot, initial encounter    Acute pain of left knee    Chronic pain of left knee   [2]   Past Medical History:  Diagnosis Date    BPH (benign prostatic hyperplasia) 10/19/1986    Elevated PSA 08/28/2021    Hypercholesterolemia 05/29/2021    Hypertension, unspecified type 05/29/2021   [3]   Past Surgical History:  Procedure Laterality Date    LIPOMA RESECTION      LUNG SURGERY      due to pneumonia - removed 2L fluid   [4]   Family History  Problem Relation Name Age of Onset    Cholesterol/Lipid Disorder Mother mother     Obesity Mother mother     Anemia Mother mother     Heart Disease Father Fairy     Heart Attack Father Fairy    [5]   Social History  Tobacco Use   Smoking Status Never   Smokeless Tobacco Never   [6]   Current Outpatient Medications on File Prior to Visit   Medication Sig Dispense Refill    ibuprofen (MOTRIN) 600 MG tablet Take 1 tablet (600 mg) by mouth every 6 hours as needed for Moderate Pain (Pain Score 4-6).      rosuvastatin  (CRESTOR ) 20 MG tablet Take 1 tablet (20 mg) by mouth daily. 100 tablet 3     No current facility-administered medications on file prior to visit.   [7] No Known Allergies
